# Patient Record
Sex: Female | Born: 1935 | Race: Black or African American | Hispanic: No | Smoking: Former smoker
Health system: Southern US, Community
[De-identification: ages and names within clinical notes are randomized; demographics above are authoritative.]

## PROBLEM LIST (undated history)

## (undated) DIAGNOSIS — D329 Benign neoplasm of meninges, unspecified: Secondary | ICD-10-CM

## (undated) DIAGNOSIS — M109 Gout, unspecified: Secondary | ICD-10-CM

## (undated) DIAGNOSIS — I10 Essential (primary) hypertension: Secondary | ICD-10-CM

## (undated) DIAGNOSIS — J45909 Unspecified asthma, uncomplicated: Secondary | ICD-10-CM

## (undated) DIAGNOSIS — F32A Depression, unspecified: Secondary | ICD-10-CM

## (undated) DIAGNOSIS — F329 Major depressive disorder, single episode, unspecified: Secondary | ICD-10-CM

## (undated) DIAGNOSIS — H409 Unspecified glaucoma: Secondary | ICD-10-CM

## (undated) DIAGNOSIS — E7800 Pure hypercholesterolemia, unspecified: Secondary | ICD-10-CM

## (undated) HISTORY — DX: Unspecified asthma, uncomplicated: J45.909

## (undated) HISTORY — DX: Major depressive disorder, single episode, unspecified: F32.9

## (undated) HISTORY — DX: Unspecified glaucoma: H40.9

## (undated) HISTORY — DX: Benign neoplasm of meninges, unspecified: D32.9

## (undated) HISTORY — DX: Gout, unspecified: M10.9

## (undated) HISTORY — DX: Depression, unspecified: F32.A

## (undated) HISTORY — DX: Essential (primary) hypertension: I10

## (undated) HISTORY — DX: Pure hypercholesterolemia, unspecified: E78.00

---

## 1999-11-02 ENCOUNTER — Encounter: Admission: RE | Admit: 1999-11-02 | Discharge: 1999-11-02 | Payer: Self-pay

## 1999-11-02 ENCOUNTER — Encounter: Payer: Self-pay | Admitting: Family Medicine

## 2000-11-03 ENCOUNTER — Encounter: Admission: RE | Admit: 2000-11-03 | Discharge: 2000-11-03 | Payer: Self-pay | Admitting: Family Medicine

## 2001-10-12 ENCOUNTER — Other Ambulatory Visit: Admission: RE | Admit: 2001-10-12 | Discharge: 2001-10-12 | Payer: Self-pay | Admitting: Family Medicine

## 2001-11-04 ENCOUNTER — Encounter: Payer: Self-pay | Admitting: Family Medicine

## 2001-11-04 ENCOUNTER — Encounter: Admission: RE | Admit: 2001-11-04 | Discharge: 2001-11-04 | Payer: Self-pay | Admitting: Family Medicine

## 2002-11-05 ENCOUNTER — Encounter: Payer: Self-pay | Admitting: Family Medicine

## 2002-11-05 ENCOUNTER — Encounter: Admission: RE | Admit: 2002-11-05 | Discharge: 2002-11-05 | Payer: Self-pay | Admitting: Family Medicine

## 2003-11-08 ENCOUNTER — Encounter: Admission: RE | Admit: 2003-11-08 | Discharge: 2003-11-08 | Payer: Self-pay | Admitting: Family Medicine

## 2003-12-19 ENCOUNTER — Encounter: Admission: RE | Admit: 2003-12-19 | Discharge: 2003-12-19 | Payer: Self-pay | Admitting: Family Medicine

## 2004-11-20 ENCOUNTER — Encounter: Admission: RE | Admit: 2004-11-20 | Discharge: 2004-11-20 | Payer: Self-pay | Admitting: Family Medicine

## 2005-11-28 ENCOUNTER — Encounter: Admission: RE | Admit: 2005-11-28 | Discharge: 2005-11-28 | Payer: Self-pay | Admitting: Family Medicine

## 2006-12-29 ENCOUNTER — Encounter: Admission: RE | Admit: 2006-12-29 | Discharge: 2006-12-29 | Payer: Self-pay | Admitting: Family Medicine

## 2007-12-31 ENCOUNTER — Encounter: Admission: RE | Admit: 2007-12-31 | Discharge: 2007-12-31 | Payer: Self-pay | Admitting: Family Medicine

## 2009-01-03 ENCOUNTER — Encounter: Admission: RE | Admit: 2009-01-03 | Discharge: 2009-01-03 | Payer: Self-pay | Admitting: Family Medicine

## 2010-03-30 ENCOUNTER — Encounter (INDEPENDENT_AMBULATORY_CARE_PROVIDER_SITE_OTHER): Payer: Self-pay | Admitting: *Deleted

## 2010-12-18 NOTE — Letter (Signed)
Summary: Colonoscopy Date Change Letter  [facility]  [address] [place], [place] [address]   Phone: [phone]  Fax: [phone]      Mar 30, 2010 MRN: [id number]   [facility]  [address] [place], [place]  [address]   Dear Ms. Bevens ,   Previously you were recommended to have a repeat colonoscopy around this time. Your chart was recently reviewed by Dr. Judie Petit T. Russella Dar of [facility]. Follow up colonoscopy is now recommended in June 2014. This revised recommendation is based on current, nationally recognized guidelines for colorectal cancer screening and polyp surveillance. These guidelines are endorsed by the American Cancer Society, [employer] on Colorectal Cancer as well as numerous other major medical organizations.  Please understand that our recommendation assumes that you do not have any new symptoms such as bleeding, a change in bowel habits, anemia, or significant abdominal discomfort. If you do have any concerning GI symptoms or want to discuss the guideline recommendations, please call to arrange an office visit at your earliest convenience. Otherwise we will keep you in our reminder system and contact you 1-2 months prior to the date listed above to schedule your next colonoscopy.  Thank you,  Judie Petit T. Russella Dar, M.D.  [facility] [phone]

## 2011-02-18 ENCOUNTER — Other Ambulatory Visit: Payer: Self-pay | Admitting: Family Medicine

## 2011-02-18 DIAGNOSIS — Z1231 Encounter for screening mammogram for malignant neoplasm of breast: Secondary | ICD-10-CM

## 2011-02-27 ENCOUNTER — Ambulatory Visit
Admission: RE | Admit: 2011-02-27 | Discharge: 2011-02-27 | Disposition: A | Discharge status: Home / Self Care | Payer: Medicare Other | Source: Ambulatory Visit | Attending: Family Medicine | Admitting: Family Medicine

## 2011-02-27 DIAGNOSIS — Z1231 Encounter for screening mammogram for malignant neoplasm of breast: Secondary | ICD-10-CM

## 2012-01-02 ENCOUNTER — Other Ambulatory Visit (HOSPITAL_COMMUNITY): Payer: Self-pay | Admitting: Family Medicine

## 2012-01-02 ENCOUNTER — Ambulatory Visit (HOSPITAL_COMMUNITY)
Admission: RE | Admit: 2012-01-02 | Discharge: 2012-01-02 | Disposition: A | Discharge status: Home / Self Care | Payer: Medicare Other | Source: Ambulatory Visit | Attending: Family Medicine | Admitting: Family Medicine

## 2012-01-02 DIAGNOSIS — R609 Edema, unspecified: Secondary | ICD-10-CM

## 2012-01-02 DIAGNOSIS — R0600 Dyspnea, unspecified: Secondary | ICD-10-CM

## 2012-01-02 DIAGNOSIS — R0989 Other specified symptoms and signs involving the circulatory and respiratory systems: Secondary | ICD-10-CM | POA: Insufficient documentation

## 2012-01-02 DIAGNOSIS — R0609 Other forms of dyspnea: Secondary | ICD-10-CM | POA: Insufficient documentation

## 2012-12-24 ENCOUNTER — Other Ambulatory Visit: Payer: Self-pay | Admitting: Family Medicine

## 2012-12-24 DIAGNOSIS — Z1231 Encounter for screening mammogram for malignant neoplasm of breast: Secondary | ICD-10-CM

## 2013-01-26 ENCOUNTER — Ambulatory Visit
Admission: RE | Admit: 2013-01-26 | Discharge: 2013-01-26 | Disposition: A | Discharge status: Home / Self Care | Payer: Medicare Other | Source: Ambulatory Visit | Attending: Family Medicine | Admitting: Family Medicine

## 2013-01-26 ENCOUNTER — Other Ambulatory Visit: Payer: Self-pay | Admitting: Family Medicine

## 2013-01-26 DIAGNOSIS — Z1231 Encounter for screening mammogram for malignant neoplasm of breast: Secondary | ICD-10-CM

## 2013-05-06 ENCOUNTER — Encounter: Payer: Self-pay | Admitting: Gastroenterology

## 2014-01-06 ENCOUNTER — Other Ambulatory Visit: Payer: Self-pay | Admitting: Family Medicine

## 2014-01-06 DIAGNOSIS — Z1231 Encounter for screening mammogram for malignant neoplasm of breast: Secondary | ICD-10-CM

## 2014-01-21 ENCOUNTER — Ambulatory Visit
Admission: RE | Admit: 2014-01-21 | Discharge: 2014-01-21 | Disposition: A | Discharge status: Home / Self Care | Payer: Self-pay | Source: Ambulatory Visit | Attending: Family Medicine | Admitting: Family Medicine

## 2014-01-21 DIAGNOSIS — Z1231 Encounter for screening mammogram for malignant neoplasm of breast: Secondary | ICD-10-CM

## 2015-01-10 ENCOUNTER — Encounter: Payer: Self-pay | Admitting: Gastroenterology

## 2015-03-15 ENCOUNTER — Other Ambulatory Visit: Payer: Self-pay

## 2015-03-15 DIAGNOSIS — Z1231 Encounter for screening mammogram for malignant neoplasm of breast: Secondary | ICD-10-CM

## 2015-03-23 ENCOUNTER — Encounter (INDEPENDENT_AMBULATORY_CARE_PROVIDER_SITE_OTHER): Payer: Self-pay

## 2015-03-23 ENCOUNTER — Ambulatory Visit
Admission: RE | Admit: 2015-03-23 | Discharge: 2015-03-23 | Disposition: A | Discharge status: Home / Self Care | Payer: Medicare Other | Source: Ambulatory Visit

## 2015-03-23 DIAGNOSIS — Z1231 Encounter for screening mammogram for malignant neoplasm of breast: Secondary | ICD-10-CM

## 2016-01-30 ENCOUNTER — Encounter: Payer: Self-pay | Admitting: Neurology

## 2016-01-30 ENCOUNTER — Ambulatory Visit (INDEPENDENT_AMBULATORY_CARE_PROVIDER_SITE_OTHER): Payer: Medicare Other | Admitting: Neurology

## 2016-01-30 VITALS — BP 128/71 | HR 104 | Ht 62.0 in | Wt 177.5 lb

## 2016-01-30 DIAGNOSIS — R269 Unspecified abnormalities of gait and mobility: Secondary | ICD-10-CM | POA: Diagnosis not present

## 2016-01-30 DIAGNOSIS — G3184 Mild cognitive impairment, so stated: Secondary | ICD-10-CM | POA: Insufficient documentation

## 2016-01-30 DIAGNOSIS — M545 Low back pain, unspecified: Secondary | ICD-10-CM | POA: Insufficient documentation

## 2016-01-30 DIAGNOSIS — M5442 Lumbago with sciatica, left side: Secondary | ICD-10-CM

## 2016-01-30 DIAGNOSIS — D329 Benign neoplasm of meninges, unspecified: Secondary | ICD-10-CM

## 2016-01-30 NOTE — Progress Notes (Addendum)
PATIENT: Leslie Warren DOB: Nov 06, 1931  Chief Complaint  Patient presents with  . Abnormal CT finding    MMSE 27/30 - 5 animals.  She is here with her daughter, Leslie Warren, to discuss a recent finding of a right-sided meningioma. Reports having difficulty with her memory.     HISTORICAL  Leslie Warren is 82 years old right-handed female, accompanied by her daughter Leslie Warren seen in refer by her primary care physician Dr.Sun Mikeal Hawthorne in January 30 2016 for evaluation of meningioma, memory loss  I reviewed and summarized referring note, she had a history of hypertension, hyperlipidemia, vitamin D deficiency known history of mild to moderate bilateral carotid artery stenosis, 40-50%.  She was noted to have memroy trouble since 2016, she tends to repeat herself, for this particular appointment, she has asked her daughter 3-4 time within a week, she knows her doctor's appointment is coming, but she could not remember the times, she has quit cooking since 2009, she forgets things on the stove, Her daughter is now cooking for her, she spent most of her day watching TV  She sleeps well, she does not have good appetite, she is not as active as she used to be, she complains of left hip pain, used a cane to walk since 2015,  She has no incontinence  Per patient and record, CAT scan of the brain without contrast in February 2017 showed meningioma, but I do not have detailed report  She also complains of chronic Leslie back pain, gradual onset progressive gait difficulty, she denies bowel and bladder incontinence.  REVIEW OF SYSTEMS: Full 14 system review of systems performed and notable only for Swelling in legs, glaucoma, shortness of breath, cough, increased thirst, achy muscles, memory loss, confusion, numbness, weakness, depression, too much sleep, decreased energy, change in appetite  ALLERGIES: No Known Allergies  HOME MEDICATIONS: Current Outpatient Prescriptions  Medication Sig Dispense Refill    . albuterol (PROVENTIL HFA;VENTOLIN HFA) 108 (90 Base) MCG/ACT inhaler Inhale into the lungs every 6 (six) hours as needed for wheezing or shortness of breath.    . allopurinol (ZYLOPRIM) 100 MG tablet Take 100 mg by mouth daily.  1  . dorzolamide (TRUSOPT) 2 % ophthalmic solution INSTILL 1 DROP INTO BOTH EYES 3 TIMES DAILY  11  . ibuprofen (ADVIL,MOTRIN) 800 MG tablet Take 800 mg by mouth every 8 (eight) hours as needed.    Marland Kitchen LUMIGAN 0.01 % SOLN INSTILL 1 DROP BY OPHTHALMIC ROUTE EVERY BEDTIME  5  . meloxicam (MOBIC) 15 MG tablet Take 15 mg by mouth daily.  5  . quinapril (ACCUPRIL) 20 MG tablet Take 20 mg by mouth daily.  1  . simvastatin (ZOCOR) 40 MG tablet Take 40 mg by mouth daily.  3  . thioridazine (MELLARIL) 25 MG tablet Take 25 mg by mouth at bedtime.  2  . triamterene-hydrochlorothiazide (MAXZIDE-25) 37.5-25 MG tablet Take 1 tablet by mouth daily.  3   No current facility-administered medications for this visit.    PAST MEDICAL HISTORY: Past Medical History  Diagnosis Date  . Hypercholesterolemia   . Hypertension   . Gout   . Depression   . Glaucoma   . Asthma   . Meningioma ([place])     PAST SURGICAL HISTORY: Past Surgical History  Procedure Laterality Date  . Cesarean section      x 1    FAMILY HISTORY: Family History  Problem Relation Age of Onset  . Diabetes Father   .  Hypertension Father   . Heart attack Mother     SOCIAL HISTORY:  Social History   Social History  . Marital Status: Widowed    Spouse Name: N/A  . Number of Children: 5  . Years of Education: 10   Occupational History  . Retired    Social History Main Topics  . Smoking status: Former Research scientist (life sciences)  . Smokeless tobacco: Not on file     Comment: Quit 10+ years ago.  . Alcohol Use: No  . Drug Use: No  . Sexual Activity: Not on file   Other Topics Concern  . Not on file   Social History Narrative   Lives with her daughter, Leslie Warren.   Right-handed.   1-2 cups caffeine per  day.     PHYSICAL EXAM   Filed Vitals:   01/30/16 1334  BP: 128/71  Pulse: 104  Height: 5\' 2"  (1.575 m)  Weight: 177 lb 8 oz (80.513 kg)    Not recorded      Body mass index is 32.46 kg/(m^2).  PHYSICAL EXAMNIATION:  Gen: NAD, conversant, well nourised, obese, well groomed                     Cardiovascular: Regular rate rhythm, no peripheral edema, warm, nontender. Eyes: Conjunctivae clear without exudates or hemorrhage Neck: Supple, no carotid bruise. Pulmonary: Clear to auscultation bilaterally  Abdomen: Football size abdomen herniation  NEUROLOGICAL EXAM:  MENTAL STATUS: Speech:    Speech is normal; fluent and spontaneous with normal comprehension.  Cognition: Mini-Mental Status Examination 27/30, animal naming 5      Orientation to time, place and person: She is not oriented to date     Recent and remote memory: She missed 2/3 recall     Normal Attention span and concentration     Normal Language, naming, repeating,spontaneous speech     Fund of knowledge   CRANIAL NERVES: CN II: Visual fields are full to confrontation. Fundoscopic exam is normal with sharp discs and no vascular changes. Pupils are round equal and briskly reactive to light. CN III, IV, VI: extraocular movement are normal. No ptosis. CN V: Facial sensation is intact to pinprick in all 3 divisions bilaterally. Corneal responses are intact.  CN VII: Face is symmetric with normal eye closure and smile. CN VIII: Hearing is normal to rubbing fingers CN IX, X: Palate elevates symmetrically. Phonation is normal. CN XI: Head turning and shoulder shrug are intact CN XII: Tongue is midline with normal movements and no atrophy.  MOTOR: There is no pronator drift of out-stretched arms. Muscle bulk and tone are normal. Muscle strength is normal, with exception of mild bilateral ankle dorsiflexion weakness  REFLEXES: Reflexes are 2+ and symmetric at the biceps, triceps, knees, and absent at ankles. Plantar  responses are flexor.  SENSORY: Length dependent decreased to light touch, pinprick, and vibratory sensation to knee level.  COORDINATION: Rapid alternating movements and fine finger movements are intact. There is no dysmetria on finger-to-nose and heel-knee-shin.    GAIT/STANCE: She needs to push up to get up from seated position, cautious, unsteady gait, could not walk on tiptoe heels and tandem walking   DIAGNOSTIC DATA (LABS, IMAGING, TESTING) - I reviewed patient records, labs, notes, testing and imaging myself where available.   ASSESSMENT AND PLAN  CHARNELLE ALBERTI is a 82 y.o. female   Mild cognitive impairment  Mini-Mental Status Examination is 86/30  Laboratory result from primary care physician Meningioma  MRI  of the brain with and without contrast Gait abnormality  She has distal weakness, sensory loss, chronic Leslie back pain, radiating pain to left leg  Most consistent with lumbar stenosis  Proceed with EMG nerve conduction study  MRI of lumbar  Marcial Pacas, M.D. Ph.D.  [facility] [address], [place], [place] [address] Ph: [phone] Fax: [phone]  CC:Sandi Mariscal, MD

## 2016-02-05 ENCOUNTER — Telehealth: Payer: Self-pay | Admitting: *Deleted

## 2016-02-05 NOTE — Telephone Encounter (Signed)
Labs collected 01/08/16:  LIPID PANEL: LDL - 88  VITAMIN D: <4.20  TSH - 0.984  CMP: CREATININE - 1.4

## 2016-02-08 ENCOUNTER — Ambulatory Visit
Admission: RE | Admit: 2016-02-08 | Discharge: 2016-02-08 | Disposition: A | Discharge status: Home / Self Care | Payer: Medicare Other | Source: Ambulatory Visit | Attending: Neurology | Admitting: Neurology

## 2016-02-08 DIAGNOSIS — M5442 Lumbago with sciatica, left side: Secondary | ICD-10-CM

## 2016-02-08 DIAGNOSIS — D329 Benign neoplasm of meninges, unspecified: Secondary | ICD-10-CM

## 2016-02-08 DIAGNOSIS — G3184 Mild cognitive impairment, so stated: Secondary | ICD-10-CM

## 2016-02-08 DIAGNOSIS — R269 Unspecified abnormalities of gait and mobility: Secondary | ICD-10-CM | POA: Diagnosis not present

## 2016-02-08 MED ORDER — GADOBENATE DIMEGLUMINE 529 MG/ML IV SOLN
15.0000 mL | Freq: Once | INTRAVENOUS | Status: AC | PRN
  Administered 2016-02-08: 15 mL via INTRAVENOUS

## 2016-02-12 ENCOUNTER — Telehealth: Payer: Self-pay | Admitting: Neurology

## 2016-02-12 NOTE — Telephone Encounter (Signed)
Spoke to [employer] - she is aware of her mother's MRI results and will have her keep her pending appts.

## 2016-02-12 NOTE — Telephone Encounter (Addendum)
error 

## 2016-02-12 NOTE — Telephone Encounter (Addendum)
Please call patient, MRI of the brain showed evidence of small vessel disease, mild atrophy, evidence of meningioma, MRI of the lumbar showed multilevel degenerative disc disease, most severe at L2-3, with evidence of nerve root compression,   I will review MRI of the brain in detail at her next follow-up visit, if she wants, may move up her follow-up appointment   IMPRESSION: This MRI of the brain with and without contrast shows the following: 1. There are some scattered T2/FLAIR hyperintense foci in the pons and cerebral hemispheres consistent with mild chronic microvascular ischemic change. The extent is apical for age. 2. There is mild cortical atrophy, slightly more pronounced in the mesial temporal lobes.  3. Attached to the falx cerebri on the right is a small 712 mm enhancing mass consistent with a benign meningioma The adjacent brain appears normal. 4. Mild chronic left maxillary sinusitis. 5. Pituitary tissue is thinned within a slightly enlarged sella turcica. This could be an incidental finding but could also be noted with increased intracranial pressure  IMPRESSION: This is an abnormal MRI of the lumbar spine with moderate to severe multilevel degenerative changes throughout the lumbar spine. The most significant findings are: 1. At L2-L3 there is severe spinal stenosis due to a large synovial cyst arising from the left facet joint that causes severe lateral recess stenosis with probable left L3 nerve root compression. Other left-sided nerve roots are also displaced at this level. 2. At L3-L4 there is moderate spinal stenosis and moderate bilateral lateral recess stenosis. There is no definite nerve root compression but there is some encroachment upon the exiting L4 nerve roots. 3. At L4-L5 there is severe spinal stenosis due to a right paramedian disc protrusion and left greater than right facet hypertrophy. There is severe bilateral lateral recess stenosis with  potential for compression of either of the L5 nerve roots. 4. Degenerative changes at T12-L1, L1-L2 and L5-S1 are less likely to lead to nerve root impingement.

## 2016-03-12 ENCOUNTER — Ambulatory Visit (INDEPENDENT_AMBULATORY_CARE_PROVIDER_SITE_OTHER): Payer: Medicare Other | Admitting: Neurology

## 2016-03-12 DIAGNOSIS — R269 Unspecified abnormalities of gait and mobility: Secondary | ICD-10-CM

## 2016-03-12 DIAGNOSIS — D329 Benign neoplasm of meninges, unspecified: Secondary | ICD-10-CM

## 2016-03-12 DIAGNOSIS — M5442 Lumbago with sciatica, left side: Secondary | ICD-10-CM

## 2016-03-12 DIAGNOSIS — G3184 Mild cognitive impairment, so stated: Secondary | ICD-10-CM

## 2016-03-12 NOTE — Procedures (Signed)
   NCS (NERVE CONDUCTION STUDY) WITH EMG (ELECTROMYOGRAPHY) REPORT   STUDY DATE: March 12 2016 PATIENT NAME: Leslie Warren DOB: 1931-01-27 MRN: [id number]    TECHNOLOGIST: Laretta Alstrom ELECTROMYOGRAPHER: Marcial Pacas M.D.  CLINICAL INFORMATION:  82 years old female with history of chronic low back pain, gradual onset gait difficulty, distal weakness, sensory loss.  FINDINGS: NERVE CONDUCTION STUDY: Bilateral peroneal sensory responses were present and within normal limits. Bilateral tibial motor responses were normal. Bilateral peroneal to EDB motor responses showed mildly decreased to C map amplitude, with normal distal latency, conduction velocity. Bilateral tibial H reflexes were normal and symmetric.  NEEDLE ELECTROMYOGRAPHY: Selective needle examinations were performed at bilateral lower extremity muscles and bilateral lumbar sacral paraspinal muscles.  Left tibialis anterior, peroneal longus, medial gastrocnemius, tibialis posterior: Increased insertional activity, 2-3 plus spontaneous activity, enlarge complex motor unit potential with decreased recruitment patterns.  Right tibialis anterior, peroneal longus, tibialis posterior, medial gastrocnemius, increased insertional activity, 1 plus positive waves, enlarge complex motor unit potential with decreased recruitment patterns.  Bilateral vastus lateralis, biceps femoris long head: Increased insertional activity, enlarged complex motor unit potential with decreased recruitment patterns.  Needle examination of bilateral lumbar sacral paraspinal muscles showed poor relaxation, there was no significant denervation potential noticed at bilateral L4-5 S1.    IMPRESSION:   This is an abnormal study. There is evidence of bilateral lumbar sacral radiculopathy mainly involving bilateral L4, 5, S1 myotomes, left worse than right.   INTERPRETING PHYSICIAN:   Marcial Pacas M.D. Ph.D. [facility] [address] [place], [place] [address] [phone]

## 2016-03-12 NOTE — Progress Notes (Signed)
PATIENT: Leslie Warren DOB: 11/22/1930  No chief complaint on file.    HISTORICAL  Leslie Warren is 82 years old right-handed female, accompanied by her daughter low Ladoris seen in refer by her primary care physician Dr.Sun Mikeal Hawthorne in January 30 2016 for evaluation of meningioma, memory loss  I reviewed and summarized referring note, she had a history of hypertension, hyperlipidemia, vitamin D deficiency known history of mild to moderate bilateral carotid artery stenosis, 40-50%.  She was noted to have memroy trouble since 2016, she tends to repeat herself, for this particular appointment, she has asked her daughter 3-4 time within a week, she knows her doctor's appointment is coming, but she could not remember the times, she has quit cooking since 2009, she forgets things on the stove, Her daughter is now cooking for her, she spent most of her day watching TV  She sleeps well, she does not have good appetite, she is not as active as she used to be, she complains of left hip pain, used a cane to walk since 2015,  She has no incontinence  Per patient and record, CAT scan of the brain without contrast in February 2017 showed meningioma, but I do not have detailed report  Update March 12 2016: She returned for electrodiagnostic study today, which showed evidence of active bilateral lumbosacral radiculopathy, mainly involving bilateral L4-5 S1 myotomes, left worse than right.  We also personally reviewed MRI of the brain April 2017: Mild Generalized atrophy, mild supratentorium small vessel disease, right falx cerebri 7 x12 mm meningioma, MRI of lumbar in April 2017, multilevel degenerative disc disease, most severe at L2-3, severe spinal stenosis, large synovia cyst from the left facet joints, causing left L3 nerve root compression, L3-4, moderate canal stenosis, severe bilateral recess stenosis, L4-5, severe spinal canal stenosis, due to right paramedian disc protrusion, severe bilateral recess  stenosis,  Patient complains of persistent low back pain, gradual worsening gait difficulty since 2015, bending forward, bilateral lower extremity paresthesia, there is no  bowel and bladder incontinence  Reviewed laboratory evaluation, LDL 88, vitamin D less than 4.2, normal TSH 0.98, CMP was normal other than elevated creatinine 1.4.  REVIEW OF SYSTEMS: Full 14 system review of systems performed and notable only for memory loss ALLERGIES: No Known Allergies  HOME MEDICATIONS: Current Outpatient Prescriptions  Medication Sig Dispense Refill  . albuterol (PROVENTIL HFA;VENTOLIN HFA) 108 (90 Base) MCG/ACT inhaler Inhale into the lungs every 6 (six) hours as needed for wheezing or shortness of breath.    . allopurinol (ZYLOPRIM) 100 MG tablet Take 100 mg by mouth daily.  1  . dorzolamide (TRUSOPT) 2 % ophthalmic solution INSTILL 1 DROP INTO BOTH EYES 3 TIMES DAILY  11  . ibuprofen (ADVIL,MOTRIN) 800 MG tablet Take 800 mg by mouth every 8 (eight) hours as needed.    Marland Kitchen LUMIGAN 0.01 % SOLN INSTILL 1 DROP BY OPHTHALMIC ROUTE EVERY BEDTIME  5  . meloxicam (MOBIC) 15 MG tablet Take 15 mg by mouth daily.  5  . quinapril (ACCUPRIL) 20 MG tablet Take 20 mg by mouth daily.  1  . simvastatin (ZOCOR) 40 MG tablet Take 40 mg by mouth daily.  3  . thioridazine (MELLARIL) 25 MG tablet Take 25 mg by mouth at bedtime.  2  . triamterene-hydrochlorothiazide (MAXZIDE-25) 37.5-25 MG tablet Take 1 tablet by mouth daily.  3   No current facility-administered medications for this visit.    PAST MEDICAL HISTORY: Past Medical History  Diagnosis Date  . Hypercholesterolemia   . Hypertension   . Gout   . Depression   . Glaucoma   . Asthma   . Meningioma ([place])     PAST SURGICAL HISTORY: Past Surgical History  Procedure Laterality Date  . Cesarean section      x 1    FAMILY HISTORY: Family History  Problem Relation Age of Onset  . Diabetes Father   . Hypertension Father   . Heart attack Mother      SOCIAL HISTORY:  Social History   Social History  . Marital Status: Widowed    Spouse Name: N/A  . Number of Children: 5  . Years of Education: 10   Occupational History  . Retired    Social History Main Topics  . Smoking status: Former Research scientist (life sciences)  . Smokeless tobacco: Not on file     Comment: Quit 10+ years ago.  . Alcohol Use: No  . Drug Use: No  . Sexual Activity: Not on file   Other Topics Concern  . Not on file   Social History Narrative   Lives with her daughter, Alysan Herrell.   Right-handed.   1-2 cups caffeine per day.     PHYSICAL EXAM   There were no vitals filed for this visit.  Not recorded      There is no weight on file to calculate BMI.  PHYSICAL EXAMNIATION:  Gen: NAD, conversant, well nourised, obese, well groomed                     Cardiovascular: Regular rate rhythm, no peripheral edema, warm, nontender. Eyes: Conjunctivae clear without exudates or hemorrhage Neck: Supple, no carotid bruise. Pulmonary: Clear to auscultation bilaterally  Abdomen: Football size abdomen herniation  NEUROLOGICAL EXAM:  MENTAL STATUS: Speech:    Speech is normal; fluent and spontaneous with normal comprehension.  Cognition: Mini-Mental Status Examination 27/30, animal naming 5      Orientation to time, place and person: She is not oriented to date     Recent and remote memory: She missed 2/3 recall     Normal Attention span and concentration     Normal Language, naming, repeating,spontaneous speech     Fund of knowledge   CRANIAL NERVES: CN II: Visual fields are full to confrontation. Pupils are round equal and briskly reactive to light. CN III, IV, VI: extraocular movement are normal. No ptosis. CN V: Facial sensation is intact to pinprick in all 3 divisions bilaterally. Corneal responses are intact.  CN VII: Face is symmetric with normal eye closure and smile. CN VIII: Hearing is normal to rubbing fingers CN IX, X: Palate elevates  symmetrically. Phonation is normal. CN XI: Head turning and shoulder shrug are intact CN XII: Tongue is midline with normal movements and no atrophy.  MOTOR: She has mild bilateral hip flexion, knee flexion weakness, left worse than right, moderate bilateral ankle dorsiflexion weakness, left worse than right.  REFLEXES: Reflexes are 2+ and symmetric at the biceps, triceps, absent at knees, and absent at ankles. Plantar responses are flexor.  SENSORY: Length dependent decreased to light touch, pinprick, and vibratory sensation to knee level.  COORDINATION: Rapid alternating movements and fine finger movements are intact. There is no dysmetria on finger-to-nose and heel-knee-shin.    GAIT/STANCE: She needs push up to get up from seated position, bending forward, unsteady gait, dragging her left leg  DIAGNOSTIC DATA (LABS, IMAGING, TESTING) - I reviewed patient records, labs, notes, testing and  imaging myself where available.   ASSESSMENT AND PLAN  LUCCIANA LEUSCHEN is a 82 y.o. female   Mild cognitive impairment  Mini-Mental Status Examination is 27/30  MRI of the brain showed mild atrophy, small vessel disease,  Need B12 level at her next yearly follow-up   Lumbar stenosis, gait abnormality, evidence of bilateral lumbar sacral radiculopathy  Patient denies surgery consideration, patient is in moderate pain, add on gabapentin 300 mg 3 times a day,     Marcial Pacas, M.D. Ph.D.  [facility] [address] [place], [place] [address] Ph: [phone] Fax: [phone]  CC:Sandi Mariscal, MD

## 2016-03-15 ENCOUNTER — Telehealth: Payer: Self-pay | Admitting: Neurology

## 2016-03-15 NOTE — Telephone Encounter (Signed)
Daughter Leslie Warren called, states when patient was here for visit 4/25, Dr. Krista Blue was going to send a Rx for pain medication to [facility]. Pharmacy has advised Rx hasn't come through yet.

## 2016-03-18 MED ORDER — GABAPENTIN 300 MG PO CAPS: 300.0000 mg | ORAL_CAPSULE | Freq: Three times a day (TID) | ORAL | Status: AC

## 2016-03-18 NOTE — Telephone Encounter (Signed)
Returned call to daugher on [employer]IP[employer] - she is aware that the prescription has been sent to the pharmacy.

## 2016-03-18 NOTE — Telephone Encounter (Signed)
Please let patient know, I have called in gabapentin 300 mg 3 times a day

## 2016-08-08 ENCOUNTER — Encounter: Payer: Self-pay | Admitting: Neurology

## 2016-08-08 ENCOUNTER — Ambulatory Visit
Admission: RE | Admit: 2016-08-08 | Discharge: 2016-08-08 | Disposition: A | Discharge status: Home / Self Care | Payer: Medicare Other | Source: Ambulatory Visit | Attending: Neurology | Admitting: Neurology

## 2016-08-08 ENCOUNTER — Ambulatory Visit (INDEPENDENT_AMBULATORY_CARE_PROVIDER_SITE_OTHER): Payer: Medicare Other | Admitting: Neurology

## 2016-08-08 VITALS — Ht 62.0 in | Wt 172.0 lb

## 2016-08-08 DIAGNOSIS — G3184 Mild cognitive impairment, so stated: Secondary | ICD-10-CM

## 2016-08-08 DIAGNOSIS — D329 Benign neoplasm of meninges, unspecified: Secondary | ICD-10-CM

## 2016-08-08 DIAGNOSIS — M5416 Radiculopathy, lumbar region: Secondary | ICD-10-CM

## 2016-08-08 NOTE — Progress Notes (Signed)
PATIENT: Leslie Warren DOB: 05/03/1931  Chief Complaint  Patient presents with  . Memory Loss    MMSE 13/30 - 3 animals.  She is here with her daughter, Leslie Warren, who says she has noticed a sharp decline in her mother's memory.  . Back Pain    Reports improvement in back pain and gait.  She has not taken gabapentin recently due to this improvement.  She uses a cane to assist with ambulation.     HISTORICAL  Leslie Warren is 82 years old right-handed female, accompanied by her daughter Leslie Warren seen in refer by her primary care physician Dr.Sun Mikeal Hawthorne on January 30 2016 for evaluation of meningioma, memory loss  I reviewed and summarized referring note, she had a history of hypertension, hyperlipidemia, vitamin D deficiency known history of mild to moderate bilateral carotid artery stenosis, 40-50%.  She was noted to have memory trouble since 2016, she tends to repeat herself, for this particular appointment, she has asked her daughter 3-4 time within a week, she knows her doctor's appointment is coming, but she could not remember the times, she has quit cooking since 2009, she forgets things on the stove, Her daughter is now cooking for her, she spent most of her day watching TV.  She graduate from high school, worked at [employer], retired at age 29, there was no family history of memory loss, she lives with her daughter Leslie Warren   She sleeps well, she does not have good appetite, she is not as active as she used to be, she complains of left hip pain, used a cane to walk since 2015,  She has no incontinence  Per patient and record, CAT scan of the brain without contrast in February 2017 showed meningioma, but I do not have detailed report  She also complains of chronic Leslie back pain, gradual onset progressive gait difficulty, she denies bowel and bladder incontinence.  UPDATE Aug 08 2016: Patient is accompanied by her daughter at today's clinical visit, she was noted to have increased  confusion, fatigue, gait abnormality since Monday Sept 18th, 2017, she does not talk much, no fever, sleeps a lot, she was taken to her primary care physician, reported UA looks normal, l We have personally reviewed MRI of the brain with without contrast in March 2017: Mild generalized atrophy, supratentorium small vessel disease, right falx cerebral small 7 times 12 mm meningioma  MRI of lumbar spine , severe spinal stenosis at L2-3 due to synovial cyst arising from the left facet joint, cause severe left recess stenosis, moderate stenosis at L 3-4, moderate bilateral lateral recess stenosis, severe stenosis at L4-5, right paramedian disc protrusion left greater than right facet hypertrophy with severe bilateral lateral recess stenosis  EMG nerve conduction study in April 2017 showed evidence of chronic bilateral lumbosacral radiculopathy  REVIEW OF SYSTEMS: Full 14 system review of systems performed and notable only for back pain, walking difficulty, memory loss   ALLERGIES: No Known Allergies  HOME MEDICATIONS: Current Outpatient Prescriptions  Medication Sig Dispense Refill  . albuterol (PROVENTIL HFA;VENTOLIN HFA) 108 (90 Base) MCG/ACT inhaler Inhale into the lungs every 6 (six) hours as needed for wheezing or shortness of breath.    . allopurinol (ZYLOPRIM) 100 MG tablet Take 100 mg by mouth daily.  1  . dorzolamide (TRUSOPT) 2 % ophthalmic solution INSTILL 1 DROP INTO BOTH EYES 3 TIMES DAILY  11  . gabapentin (NEURONTIN) 300 MG capsule Take 1 capsule (300 mg total)  by mouth 3 (three) times daily. 90 capsule 11  . ibuprofen (ADVIL,MOTRIN) 800 MG tablet Take 800 mg by mouth every 8 (eight) hours as needed.    Marland Kitchen LUMIGAN 0.01 % SOLN INSTILL 1 DROP BY OPHTHALMIC ROUTE EVERY BEDTIME  5  . meloxicam (MOBIC) 15 MG tablet Take 15 mg by mouth daily.  5  . quinapril (ACCUPRIL) 20 MG tablet Take 20 mg by mouth daily.  1  . simvastatin (ZOCOR) 40 MG tablet Take 40 mg by mouth daily.  3  .  thioridazine (MELLARIL) 25 MG tablet Take 25 mg by mouth at bedtime.  2  . triamterene-hydrochlorothiazide (MAXZIDE-25) 37.5-25 MG tablet Take 1 tablet by mouth daily.  3   No current facility-administered medications for this visit.     PAST MEDICAL HISTORY: Past Medical History:  Diagnosis Date  . Asthma   . Depression   . Glaucoma   . Gout   . Hypercholesterolemia   . Hypertension   . Meningioma ([place])     PAST SURGICAL HISTORY: Past Surgical History:  Procedure Laterality Date  . CESAREAN SECTION     x 1    FAMILY HISTORY: Family History  Problem Relation Age of Onset  . Diabetes Father   . Hypertension Father   . Heart attack Mother     SOCIAL HISTORY:  Social History   Social History  . Marital status: Widowed    Spouse name: N/A  . Number of children: 5  . Years of education: 10   Occupational History  . Retired    Social History Main Topics  . Smoking status: Former Research scientist (life sciences)  . Smokeless tobacco: Not on file     Comment: Quit 10+ years ago.  . Alcohol use No  . Drug use: No  . Sexual activity: Not on file   Other Topics Concern  . Not on file   Social History Narrative   Lives with her daughter, Leslie Warren.   Right-handed.   1-2 cups caffeine per day.     PHYSICAL EXAM   Vitals:   08/08/16 1155  Weight: 172 lb (78 kg)  Height: 5\' 2"  (1.575 m)    Not recorded      Body mass index is 31.46 kg/m.  PHYSICAL EXAMNIATION:  Gen: NAD, conversant, well nourised, obese, well groomed                     Cardiovascular: Regular rate rhythm, no peripheral edema, warm, nontender. Eyes: Conjunctivae clear without exudates or hemorrhage Neck: Supple, no carotid bruise. Pulmonary: Clear to auscultation bilaterally  Abdomen: Football size abdomen herniation  NEUROLOGICAL EXAM:  MENTAL STATUS: Speech: She follows simple commands, depend on her daughter to provide history,  Cognition: Mini-Mental Status Examination13/30, animal  naming  3     Orientation: She is not oriented to datePlace,     Recent and remote memory: She missed 2/3 recall     Attention span and concentration: She could not spell world backwards     Normal Language, naming, repeating,spontaneous speech, She has difficulty copy design, could not write a sentence,     Fund of knowledge   CRANIAL NERVES: CN II: Visual fields are full to confrontation. Fundoscopic exam is normal with sharp discs and no vascular changes. Pupils are round equal and briskly reactive to light. CN III, IV, VI: extraocular movement are normal. No ptosis. CN V: Facial sensation is intact to pinprick in all 3 divisions bilaterally. Corneal responses are  intact.  CN VII: Face is symmetric with normal eye closure and smile. CN VIII: Hearing is normal to rubbing fingers CN IX, X: Palate elevates symmetrically. Phonation is normal. CN XI: Head turning and shoulder shrug are intact CN XII: Tongue is midline with normal movements and no atrophy.  MOTOR: There is no pronator drift of out-stretched arms. Muscle bulk and tone are normal. Muscle strength is normal, with exception of mild bilateral ankle dorsiflexion weakness  REFLEXES: Reflexes are 2+ and symmetric at the biceps, triceps, knees, and absent at ankles. Plantar responses are flexor.  SENSORY: Length dependent decreased to light touch, pinprick, and vibratory sensation to knee level.  COORDINATION: Rapid alternating movements and fine finger movements are intact. There is no dysmetria on finger-to-nose and heel-knee-shin.    GAIT/STANCE: She needs to push up to get up from seated position, leaning forward depend on her walker, unsteady gait   DIAGNOSTIC DATA (LABS, IMAGING, TESTING) - I reviewed patient records, labs, notes, testing and imaging myself where available.   ASSESSMENT AND PLAN  MARYMARGARET ECKEL is a 82 y.o. female   Mild cognitive impairment  Acute worsening since August 05 2016  laboratory  evaluation to rule out dehydration, thyroid malfunction,  EEG  I have encouraged her daughter close supervised patient, keep well hydration,   Gait abnormality  Evidence of multilevel severe lumbar stenosis,  discussed with patient and her daughter, she is not in severe pain, does not want to consider surgical option  Marcial Pacas, M.D. Ph.D.  [facility] [address] [place], [place] [address] Ph: [phone] Fax: [phone]  CC:Leslie Mariscal, MD

## 2016-08-09 ENCOUNTER — Telehealth: Payer: Self-pay | Admitting: Neurology

## 2016-08-09 LAB — CBC
Hematocrit: 38.6 % (ref 34.0–46.6)
Hemoglobin: 12.8 g/dL (ref 11.1–15.9)
MCH: 27.3 pg (ref 26.6–33.0)
MCHC: 33.2 g/dL (ref 31.5–35.7)
MCV: 82 fL (ref 79–97)
PLATELETS: 261 10*3/uL (ref 150–379)
RBC: 4.69 x10E6/uL (ref 3.77–5.28)
RDW: 13.1 % (ref 12.3–15.4)
WBC: 8.8 10*3/uL (ref 3.4–10.8)

## 2016-08-09 LAB — COMPREHENSIVE METABOLIC PANEL
ALT: 22 IU/L (ref 0–32)
AST: 20 IU/L (ref 0–40)
Albumin/Globulin Ratio: 1.5 (ref 1.2–2.2)
Albumin: 4 g/dL (ref 3.5–4.7)
Alkaline Phosphatase: 86 IU/L (ref 39–117)
BUN/Creatinine Ratio: 11 — ABNORMAL LOW (ref 12–28)
BUN: 13 mg/dL (ref 8–27)
Bilirubin Total: 0.4 mg/dL (ref 0.0–1.2)
CALCIUM: 11.7 mg/dL — AB (ref 8.7–10.3)
CO2: 27 mmol/L (ref 18–29)
CREATININE: 1.14 mg/dL — AB (ref 0.57–1.00)
Chloride: 91 mmol/L — ABNORMAL LOW (ref 96–106)
GFR, EST AFRICAN AMERICAN: 52 mL/min/{1.73_m2} — AB (ref >59)
GFR, EST NON AFRICAN AMERICAN: 45 mL/min/{1.73_m2} — AB (ref >59)
GLUCOSE: 100 mg/dL — AB (ref 65–99)
Globulin, Total: 2.7 g/dL (ref 1.5–4.5)
Potassium: 3.6 mmol/L (ref 3.5–5.2)
Sodium: 131 mmol/L — ABNORMAL LOW (ref 134–144)
TOTAL PROTEIN: 6.7 g/dL (ref 6.0–8.5)

## 2016-08-09 LAB — TSH: TSH: 1.26 u[IU]/mL (ref 0.450–4.500)

## 2016-08-09 NOTE — Telephone Encounter (Addendum)
Please call patient x-ray showed bronchitis changes,  Laboratory evaluation showed mildly decreased sodium, chloride, increased calcium,   if she continue to cough, continue to be off her baseline, with increased confusion, fatigue, may consider follow-up with her primary care physician again, also repeat lab again, I have faxed the lab result to her PCP Dr. Nancy Fetter  FINDINGS: Heart size is mildly enlarged. Aorta is tortuous. There are no focal consolidations or pleural effusions. No pulmonary edema. Mild perihilar bronchitic changes are present. Mid thoracic spondylosis.  IMPRESSION: 1. Stable cardiomegaly. 2. Bronchitic changes. No focal acute pulmonary abnormality.

## 2016-08-09 NOTE — Telephone Encounter (Signed)
Left message for Leslie Warren [phone]) to return my call.

## 2016-08-12 NOTE — Telephone Encounter (Signed)
Spoke to Leslie Warren - she is aware of her mother's test results.  She verbalized understanding and will keep a close eyes on her symptoms.

## 2016-08-14 ENCOUNTER — Ambulatory Visit (INDEPENDENT_AMBULATORY_CARE_PROVIDER_SITE_OTHER): Payer: Medicare Other | Admitting: Neurology

## 2016-08-14 DIAGNOSIS — M5416 Radiculopathy, lumbar region: Secondary | ICD-10-CM

## 2016-08-14 DIAGNOSIS — D329 Benign neoplasm of meninges, unspecified: Secondary | ICD-10-CM

## 2016-08-14 DIAGNOSIS — G3184 Mild cognitive impairment, so stated: Secondary | ICD-10-CM

## 2016-08-14 DIAGNOSIS — R410 Disorientation, unspecified: Secondary | ICD-10-CM | POA: Diagnosis not present

## 2016-08-14 NOTE — Procedures (Signed)
   HISTORY: History of meningioma, memory loss, presented with increased confusion,  TECHNIQUE:  16 channel EEG was performed based on standard 10-16 international system. One channel was dedicated to EKG, which has demonstrates normal sinus rhythm of 84 beats per minutes.  Upon awakening, the posterior background activity was dysarrhythmic, in theta range, reactive to eye opening and closure. There was frequent higher amplitude slower delta range activity is bilateral frontal area.  There was no evidence of epileptiform discharge.  Photic stimulation was performed, which induced a symmetric photic driving, continued evidence of generalized dysrhythmic slowing.  Hyperventilation was not performed.  No sleep was achieved.  CONCLUSION: This is a  an abnormal EEG.  There is electrodiagnostic evidence of generalized slowing, consistent with bihemisphere malfunction, common etiology is metabolic toxic.   Marcial Pacas, M.D. Ph.D.  [facility] [place], [place] [address] Phone: [phone] Fax:      [phone]

## 2016-08-21 ENCOUNTER — Telehealth: Payer: Self-pay | Admitting: Neurology

## 2016-08-21 NOTE — Telephone Encounter (Signed)
Attempted to reach again before leaving the office. Left another message.

## 2016-08-21 NOTE — Telephone Encounter (Signed)
Left message for a return call

## 2016-08-21 NOTE — Telephone Encounter (Signed)
Please call patient there is EEG evidence of slowing, consistent with bihemisphere malfunction, there was no evidence of epileptiform discharge   Please also check with patient to see if her symptoms has improved  This is a  an abnormal EEG.  There is electrodiagnostic evidence of generalized slowing, consistent with bihemisphere malfunction, common etiology is metabolic toxic

## 2016-08-21 NOTE — Telephone Encounter (Signed)
Pt's daughter called request EEG results. She can be reached on work or cell [phone]

## 2016-08-22 NOTE — Telephone Encounter (Signed)
Spoke to [employer] (dgt on HIPAA) - she is aware of her mother's EEG results.

## 2016-08-22 NOTE — Telephone Encounter (Signed)
Pt's daughter returned Michelle's call. She can be reached at [phone]

## 2016-09-11 ENCOUNTER — Ambulatory Visit: Payer: Medicare Other | Admitting: Neurology

## 2016-11-13 ENCOUNTER — Encounter: Payer: Self-pay | Admitting: Nurse Practitioner

## 2016-11-13 ENCOUNTER — Ambulatory Visit (INDEPENDENT_AMBULATORY_CARE_PROVIDER_SITE_OTHER): Payer: Medicare Other | Admitting: Nurse Practitioner

## 2016-11-13 VITALS — BP 128/84 | HR 96 | Ht 62.0 in | Wt 172.0 lb

## 2016-11-13 DIAGNOSIS — R269 Unspecified abnormalities of gait and mobility: Secondary | ICD-10-CM | POA: Diagnosis not present

## 2016-11-13 DIAGNOSIS — G3184 Mild cognitive impairment, so stated: Secondary | ICD-10-CM | POA: Diagnosis not present

## 2016-11-13 DIAGNOSIS — D329 Benign neoplasm of meninges, unspecified: Secondary | ICD-10-CM | POA: Diagnosis not present

## 2016-11-13 NOTE — Progress Notes (Signed)
[facility]  PATIENT: OCIE KALB DOB: 1931/09/14   REASON FOR VISIT: Follow-up for memory loss, meningioma HISTORY FROM: Patient and daughter    HISTORY OF PRESENT ILLNESS:UPDATE 12/27/2017CM Ms. Creary, 82 year old female returns for follow-up with her daughter with whom she lives. She has been noted to have memory trouble since 2016, tends to repeat herself. She no longer cooks. Appetite is good and she sleeps well, no wandering behavior. No recent falls, ambulates with single-point cane. There is no urinary incontinence. She has had a progressive gait disorder. EEG 08/14/2016 was abnormal with electrodiagnostic evidence of generalized slowing consistent with bihemispheric malfunction, etiology is metabolic toxic. Chest x-ray after last visit showed bronchitis changes. May consider follow-up with PCP if she continues to cough from her baseline. She returns for reevaluation  UPDATE Aug 08 2016:YY Patient is accompanied by her daughter at today's clinical visit, she was noted to have increased confusion, fatigue, gait abnormality since Monday Sept 18th, 2017, she does not talk much, no fever, sleeps a lot, she was taken to her primary care physician, reported UA looks normal, l We have personally reviewed MRI of the brain with without contrast in March 2017: Mild generalized atrophy, supratentorium small vessel disease, right falx cerebral small 7 times 12 mm meningioma  MRI of lumbar spine , severe spinal stenosis at L2-3 due to synovial cyst arising from the left facet joint, cause severe left recess stenosis, moderate stenosis at L 3-4, moderate bilateral lateral recess stenosis, severe stenosis at L4-5, right paramedian disc protrusion left greater than right facet hypertrophy with severe bilateral lateral recess stenosis  EMG nerve conduction study in April 2017 showed evidence of chronic bilateral lumbosacral radiculopathy HISTORY  GHINA MEHRHOFF is 82 years old  right-handed female, accompanied by her daughter low Ladoris seen in refer by her primary care physician Dr.Sun Mikeal Hawthorne on January 30 2016 for evaluation of meningioma, memory loss  I reviewed and summarized referring note, she had a history of hypertension, hyperlipidemia, vitamin D deficiency known history of mild to moderate bilateral carotid artery stenosis, 40-50%.  She was noted to have memory trouble since 2016, she tends to repeat herself, for this particular appointment, she has asked her daughter 3-4 time within a week, she knows her doctor's appointment is coming, but she could not remember the times, she has quit cooking since 2009, she forgets things on the stove, Her daughter is now cooking for her, she spent most of her day watching TV.  She graduate from high school, worked at [employer], retired at age 37, there was no family history of memory loss, she lives with her daughter [name]   She sleeps well, she does not have good appetite, she is not as active as she used to be, she complains of left hip pain, used a cane to walk since 2015,  She has no incontinence  Per patient and record, CAT scan of the brain without contrast in February 2017 showed meningioma, but I do not have detailed report  She also complains of chronic low back pain, gradual onset progressive gait difficulty, she denies bowel and bladder incontinence.    REVIEW OF SYSTEMS: Full 14 system review of systems performed and notable only for those listed, all others are neg:  Constitutional: neg  Cardiovascular: neg Ear/Nose/Throat: neg  Skin: neg Eyes: neg Respiratory: Cough Gastroitestinal: neg  Hematology/Lymphatic: neg  Endocrine: neg Musculoskeletal:neg Allergy/Immunology: neg Neurological: neg Psychiatric: neg Sleep : neg   ALLERGIES: No Known  Allergies  HOME MEDICATIONS: Outpatient Medications Prior to Visit  Medication Sig Dispense Refill  . albuterol (PROVENTIL HFA;VENTOLIN HFA) 108 (90  Base) MCG/ACT inhaler Inhale into the lungs every 6 (six) hours as needed for wheezing or shortness of breath.    . dorzolamide (TRUSOPT) 2 % ophthalmic solution INSTILL 1 DROP INTO BOTH EYES 3 TIMES DAILY  11  . ibuprofen (ADVIL,MOTRIN) 800 MG tablet Take 800 mg by mouth every 8 (eight) hours as needed.    Marland Kitchen LUMIGAN 0.01 % SOLN INSTILL 1 DROP BY OPHTHALMIC ROUTE EVERY BEDTIME  5  . meloxicam (MOBIC) 15 MG tablet Take 15 mg by mouth daily.  5  . quinapril (ACCUPRIL) 20 MG tablet Take 20 mg by mouth daily.  1  . simvastatin (ZOCOR) 40 MG tablet Take 40 mg by mouth daily.  3  . thioridazine (MELLARIL) 25 MG tablet Take 25 mg by mouth at bedtime.  2  . triamterene-hydrochlorothiazide (MAXZIDE-25) 37.5-25 MG tablet Take 1 tablet by mouth daily.  3  . allopurinol (ZYLOPRIM) 100 MG tablet Take 100 mg by mouth daily.  1  . gabapentin (NEURONTIN) 300 MG capsule Take 1 capsule (300 mg total) by mouth 3 (three) times daily. (Patient not taking: Reported on 11/13/2016) 90 capsule 11   No facility-administered medications prior to visit.     PAST MEDICAL HISTORY: Past Medical History:  Diagnosis Date  . Asthma   . Depression   . Glaucoma   . Gout   . Hypercholesterolemia   . Hypertension   . Meningioma ([place])     PAST SURGICAL HISTORY: Past Surgical History:  Procedure Laterality Date  . CESAREAN SECTION     x 1    FAMILY HISTORY: Family History  Problem Relation Age of Onset  . Heart attack Mother   . Diabetes Father   . Hypertension Father     SOCIAL HISTORY: Social History   Social History  . Marital status: Widowed    Spouse name: N/A  . Number of children: 5  . Years of education: 10   Occupational History  . Retired    Social History Main Topics  . Smoking status: Former Research scientist (life sciences)  . Smokeless tobacco: Never Used     Comment: Quit 10+ years ago.  . Alcohol use No  . Drug use: No  . Sexual activity: Not on file   Other Topics Concern  . Not on file   Social  History Narrative   Lives with her daughter, Clio Falana.   Right-handed.   1-2 cups caffeine per day.     PHYSICAL EXAM  Vitals:   11/13/16 1240  BP: 128/84  Pulse: 96  Weight: 172 lb (78 kg)  Height: 5\' 2"  (1.575 m)   Body mass index is 31.46 kg/m.  Generalized: Well developed, Obese female in no acute distress , well-groomed Head: normocephalic and atraumatic,. Oropharynx benign  Neck: Supple, no carotid bruits  Cardiac: Regular rate rhythm, no murmur  Musculoskeletal: No deformity  Abdomen football size abdominal hernia  Neurological examination   Mentation: Alert  MMSE - Mini Mental State Exam 11/13/2016 08/08/2016 01/30/2016  Orientation to time 3 0 4  Orientation to Place 2 4 5   Registration 3 3 3   Attention/ Calculation 1 0 5  Recall 0 0 1  Language- name 2 objects 2 2 2   Language- repeat 1 1 1   Language- follow 3 step command 2 2 3   Language- read & follow direction 0 1 1  Write  a sentence 0 0 1  Copy design 0 0 1  Total score 14 13 27    Follows all commands speech and language fluent.   Cranial nerve II-XII: Fundoscopic exam reveals sharp disc margins.Pupils were equal round reactive to light extraocular movements were full, visual field were full on confrontational test. Facial sensation and strength were normal. hearing was intact to finger rubbing bilaterally. Uvula tongue midline. head turning and shoulder shrug were normal and symmetric.Tongue protrusion into cheek strength was normal. Motor: normal bulk and tone, full strength in the BUE, BLE, fine finger movements normal, except mild bilateral ankle dorsiflexion weakness  Sensory: Length dependent  decreased to light touch pinprick and vibratory sensation to knee level Coordination: finger-nose-finger, heel-to-shin bilaterally, no dysmetria Reflexes: Brachioradialis 2/2, biceps 2/2, triceps 2/2, patellar 2/2, Achilles absent, plantar responses were flexor bilaterally. Gait and Station: Rising up  from seated position with pushoff leaning forward, dependent on her cane unsteady gait  DIAGNOSTIC DATA (LABS, IMAGING, TESTING) - I reviewed patient records, labs, notes, testing and imaging myself where available.  Lab Results  Component Value Date   WBC 8.8 08/08/2016   HCT 38.6 08/08/2016   MCV 82 08/08/2016   PLT 261 08/08/2016      Component Value Date/Time   NA 131 (L) 08/08/2016 1253   K 3.6 08/08/2016 1253   CL 91 (L) 08/08/2016 1253   CO2 27 08/08/2016 1253   GLUCOSE 100 (H) 08/08/2016 1253   BUN 13 08/08/2016 1253   CREATININE 1.14 (H) 08/08/2016 1253   CALCIUM 11.7 (H) 08/08/2016 1253   PROT 6.7 08/08/2016 1253   ALBUMIN 4.0 08/08/2016 1253   AST 20 08/08/2016 1253   ALT 22 08/08/2016 1253   ALKPHOS 86 08/08/2016 1253   BILITOT 0.4 08/08/2016 1253   GFRNONAA 45 (L) 08/08/2016 1253   GFRAA 52 (L) 08/08/2016 1253    Lab Results  Component Value Date   TSH 1.260 08/08/2016      ASSESSMENT AND PLAN  82 y.o. year old female  has a past medical history of Asthma; Depression; Glaucoma; Gout; Hypercholesterolemia; Hypertension; and Meningioma ([place]). Mild memory loss here to follow-up.   Memory score is stable Stay well hydrated, small frequent meals finger foods Gait abnormality Evidence of  severe lumbar  stenosis  she is not in severe pain, does not want to consider surgical option Use walker/cane  at all times Follow-up in 6-8 months Dennie Bible, [facility], [facility], [facility] [address], [place], [place] [address] [phone]

## 2016-11-13 NOTE — Patient Instructions (Signed)
Memory score is stable Stay well hydrated, small frequent meals finger foods Follow-up in 6-8 months

## 2016-11-13 NOTE — Progress Notes (Signed)
I have reviewed and agreed above plan. 

## 2017-01-22 IMAGING — CR DG CHEST 2V
3 series · 3 of 3 positions shown · non-contrast
Comparison: 01/26/2013

CLINICAL DATA: Monitoring chronic cough / no fever / concern for
infiltrate / jdh 315

EXAM:
CHEST  2 VIEW

[w chest pa]
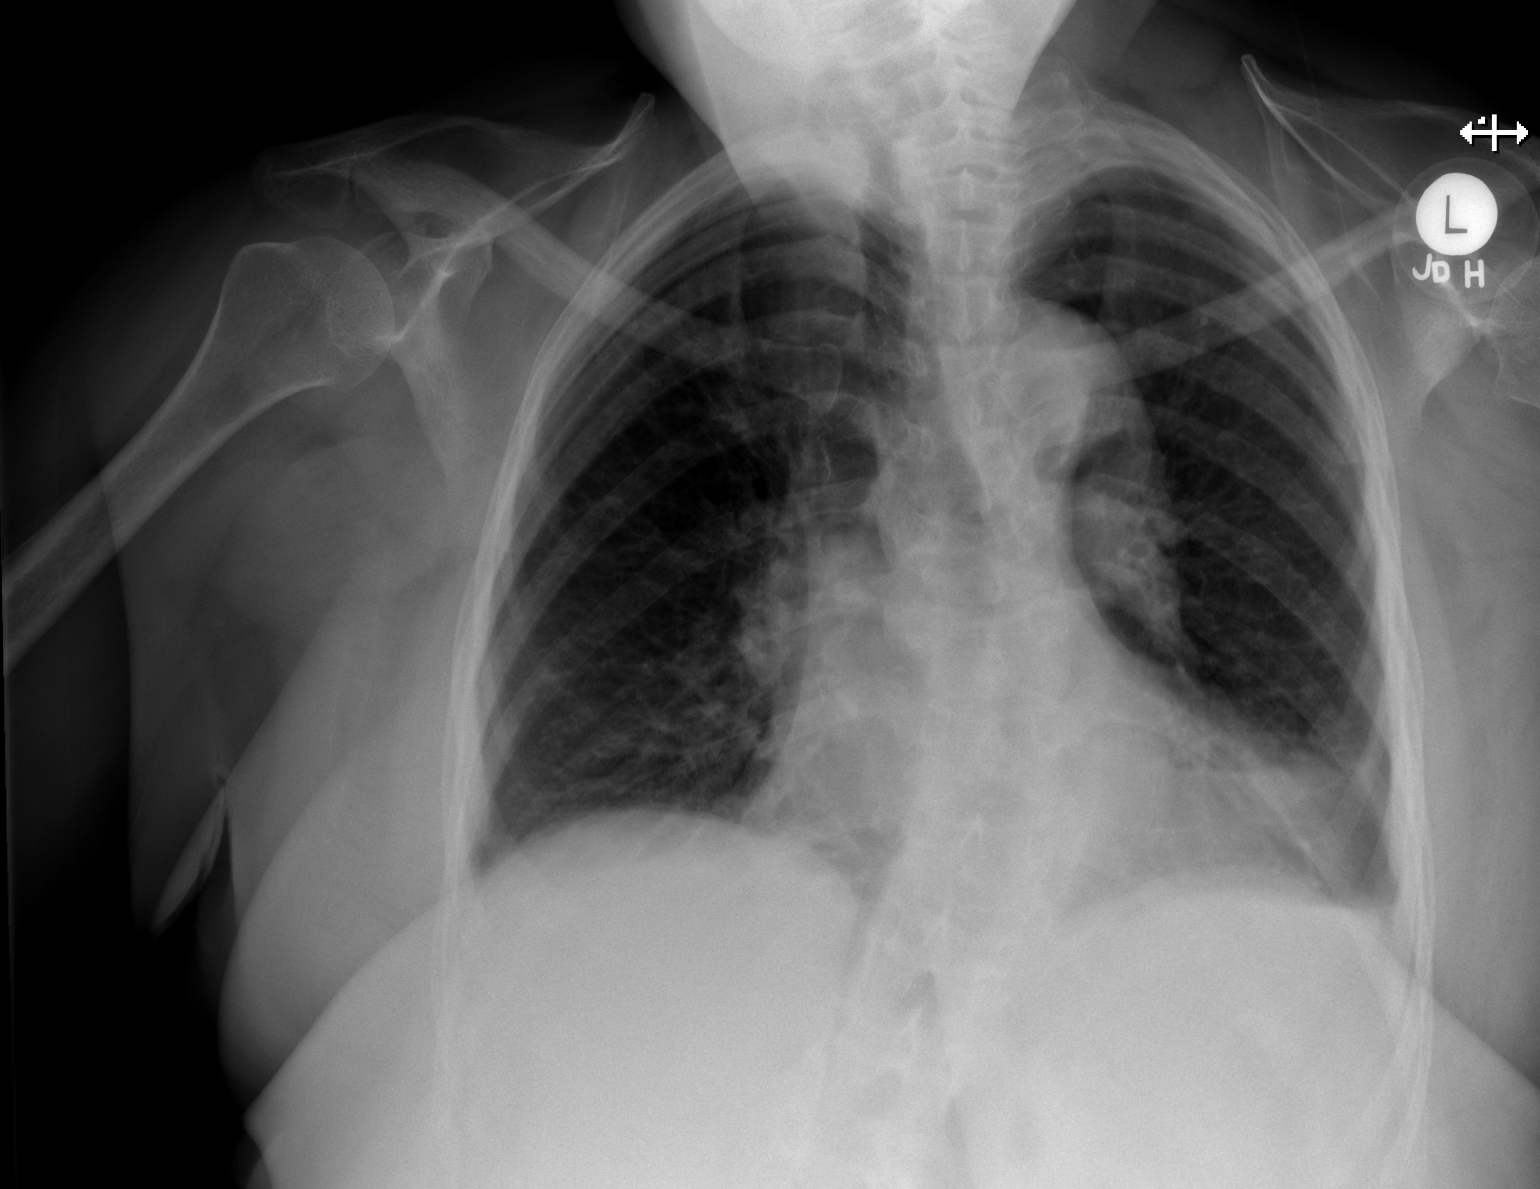

[w chest lat]
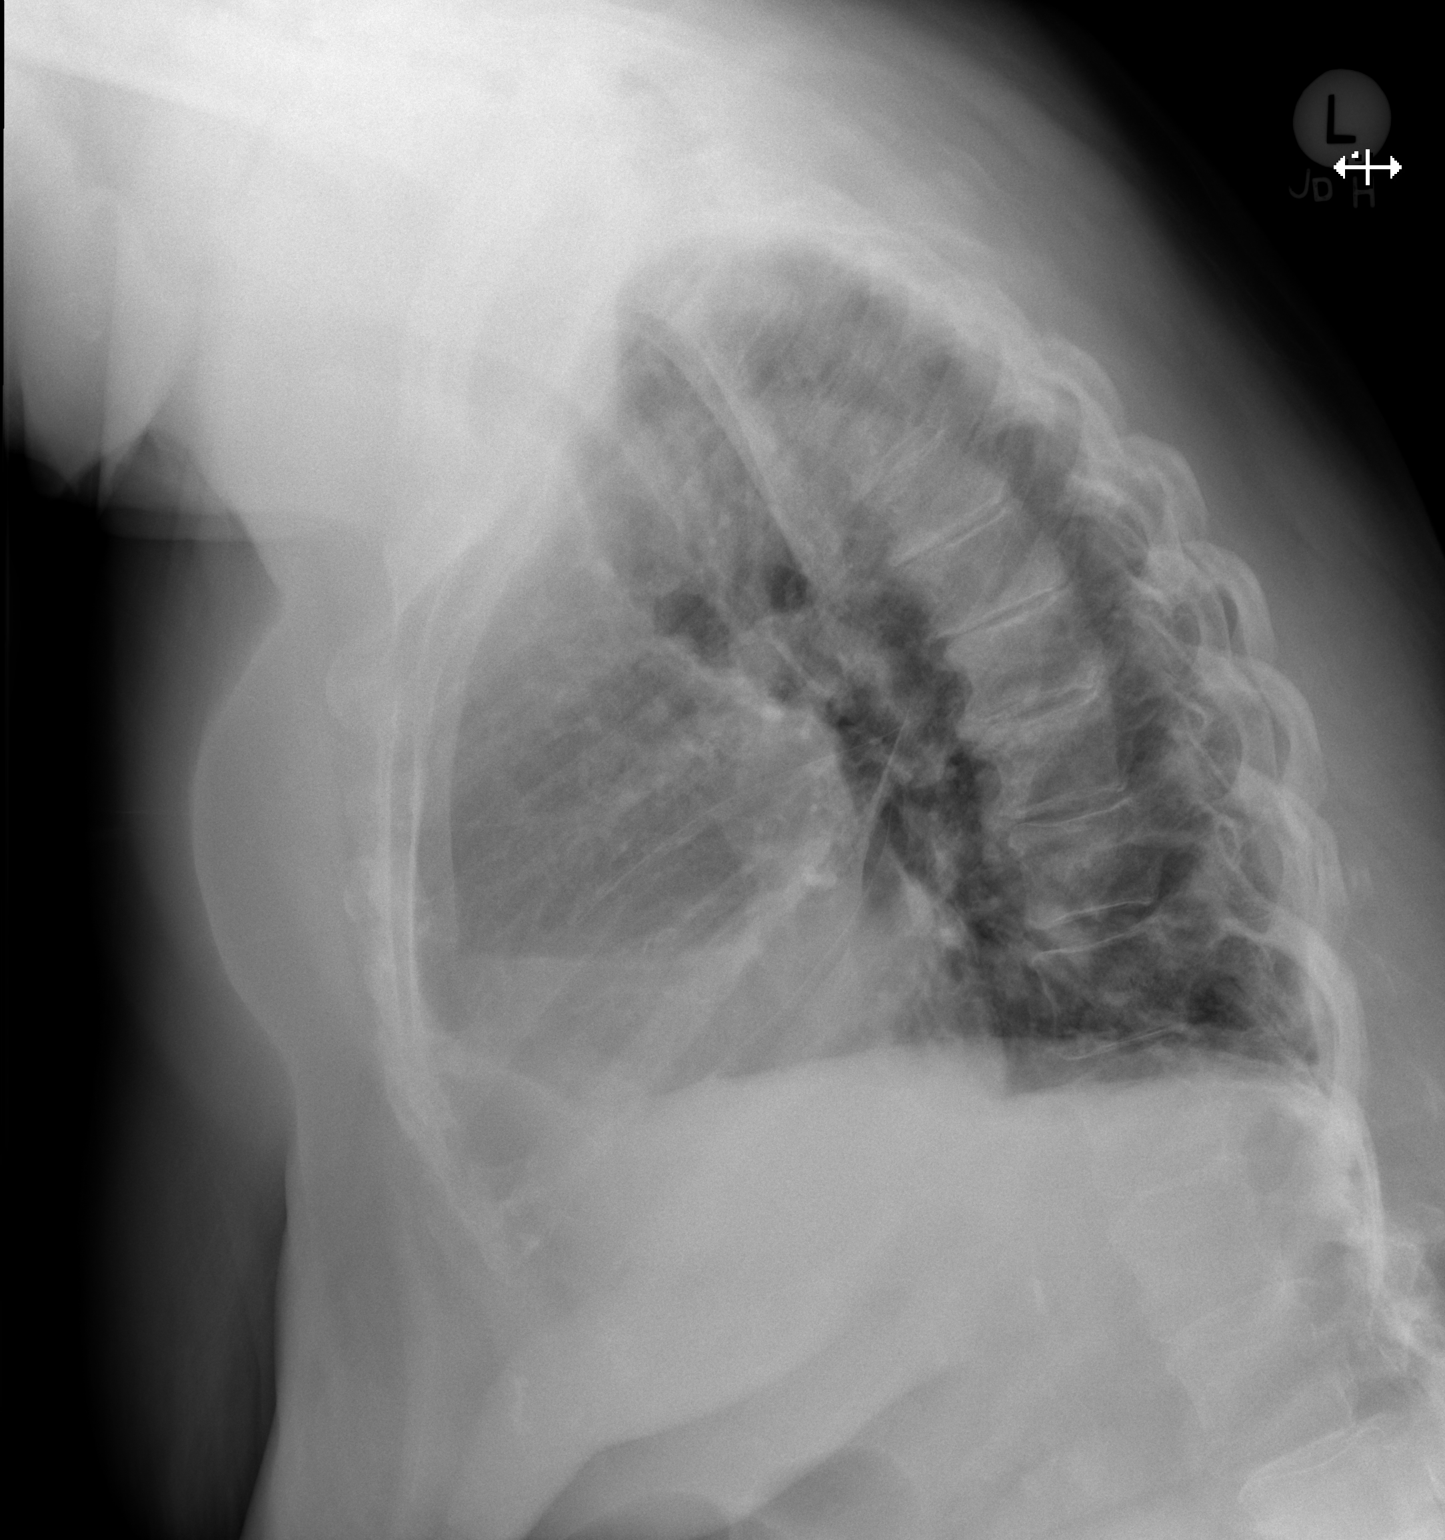

[w chest ap 4-7yrs (14-20cm)]
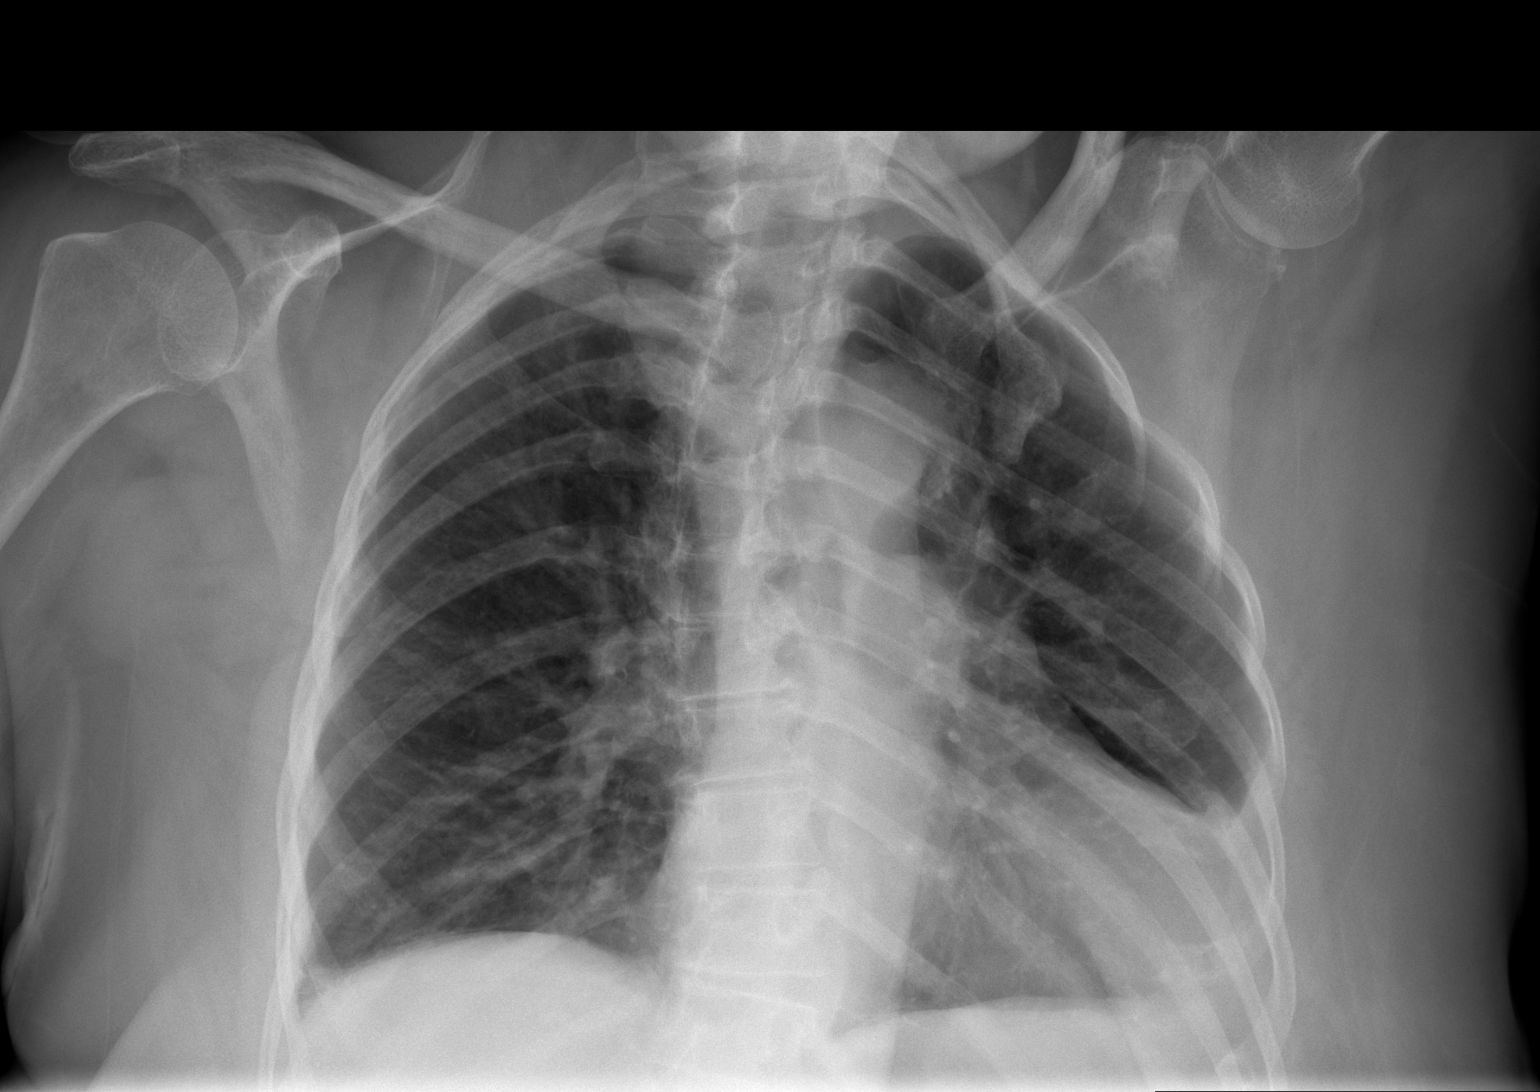

[3 of 3 positions shown; findings below may reference images not displayed]

FINDINGS: Heart size is mildly enlarged. Aorta is tortuous. There are no focal
consolidations or pleural effusions. No pulmonary edema. Mild
perihilar bronchitic changes are present. Mid thoracic spondylosis.
IMPRESSION: 1. Stable cardiomegaly.
2. Bronchitic changes.  No focal acute pulmonary abnormality.

## 2017-07-01 ENCOUNTER — Ambulatory Visit: Payer: Medicare Other | Admitting: Nurse Practitioner

## 2017-07-08 NOTE — Progress Notes (Signed)
[facility]  Warren: Leslie Warren DOB: [birth year]   REASON FOR VISIT: Follow-up for memory loss, meningioma HISTORY FROM: Warren and daughter Leslie Warren    HISTORY OF PRESENT ILLNESS:UPDATE  08/22/2018CM   Leslie Warren,  83 year old female returns for follow up  with history of memory loss since 2016 .  She also has a significant history of psychiatric illness according to daughter and has been on Mellaril for years. She goes to [employer].  She no longer cooks, no recent falls walks with a cane. Appetite good sleeping well no behavior issues. EEG 08/14/2016 was abnormal with electrodiagnostic evidence of generalized slowing consistent with bihemispheric malfunction, etiology is metabolic toxic.  Daughter lives with her Memory  With good and bad days. She has little speech. MRI of the brain with without contrast in March 2017: Mild generalized atrophy, supratentorium small vessel disease, right falx cerebral small 7 times 12 mm meningioma.  She has severe lumbar spinal stenosis However she denies any back pain. She returns fevaluation    UPDATE 12/27/2017CM Leslie Warren, 83 year old female returns for follow-up with her daughter with whom she lives. She has been noted to have memory trouble since 2016, tends to repeat herself. She no longer cooks. Appetite is good and she sleeps well, no wandering behavior. No recent falls, ambulates with single-point cane. There is no urinary incontinence. She has had a progressive gait disorder. EEG 08/14/2016 was abnormal with electrodiagnostic evidence of generalized slowing consistent with bihemispheric malfunction, etiology is metabolic toxic. Chest x-ray after last visit showed bronchitis changes. May consider follow-up with PCP if she continues to cough from her baseline. She returns for reevaluation  UPDATE Aug 08 2016:Leslie Warren is accompanied by her daughter at today's clinical visit, she was noted to have increased  confusion, fatigue, gait abnormality since Monday Sept 18th, 2017, she does not talk much, no fever, sleeps a lot, she was taken to her primary care physician, reported UA looks normal, l We have personally reviewed MRI of the brain with without contrast in March 2017: Mild generalized atrophy, supratentorium small vessel disease, right falx cerebral small 7 times 12 mm meningioma  MRI of lumbar spine , severe spinal stenosis at L2-3 due to synovial cyst arising from the left facet joint, cause severe left recess stenosis, moderate stenosis at L 3-4, moderate bilateral lateral recess stenosis, severe stenosis at L4-5, right paramedian disc protrusion left greater than right facet hypertrophy with severe bilateral lateral recess stenosis  EMG nerve conduction study in April 2017 showed evidence of chronic bilateral lumbosacral radiculopathy HISTORY  Leslie Warren is 83 years old right-handed female, accompanied by her daughter low Ladoris seen in refer by her primary care physician Dr.Sun Mikeal Hawthorne on January 30 2016 for evaluation of meningioma, memory loss  I reviewed and summarized referring note, she had a history of hypertension, hyperlipidemia, vitamin D deficiency known history of mild to moderate bilateral carotid artery stenosis, 40-50%.  She was noted to have memory trouble since 2016, she tends to repeat herself, for this particular appointment, she has asked her daughter 3-4 time within a week, she knows her doctor's appointment is coming, but she could not remember the times, she has quit cooking since 2009, she forgets things on the stove, Her daughter is now cooking for her, she spent most of her day watching TV.  She graduate from high school, worked at [employer], retired at age 9, there was no family history of memory loss, she lives  with her daughter Leslie Warren   She sleeps well, she does not have good appetite, she is not as active as she used to be, she complains of left hip pain, used  a cane to walk since 2015,  She has no incontinence  Per Warren and record, CAT scan of the brain without contrast in February 2017 showed meningioma, but I do not have detailed report  She also complains of chronic low back pain, gradual onset progressive gait difficulty, she denies bowel and bladder incontinence.    REVIEW OF SYSTEMS: Full 14 system review of systems performed and notable only for those listed, all others are neg:  Constitutional: neg  Cardiovascular: neg Ear/Nose/Throat: neg  Skin: neg Eyes: neg Respiratory: Cough Gastroitestinal: neg  Hematology/Lymphatic: neg  Endocrine: neg Musculoskeletal:walking diff Allergy/Immunology: neg Neurological:  Memory loss Psychiatric:  Long history of psychiatric illness none specified. Sleep : neg   ALLERGIES: No Known Allergies  HOME MEDICATIONS: Outpatient Medications Prior to Visit  Medication Sig Dispense Refill  . albuterol (PROVENTIL HFA;VENTOLIN HFA) 108 (90 Base) MCG/ACT inhaler Inhale into the lungs every 6 (six) hours as needed for wheezing or shortness of breath.    . allopurinol (ZYLOPRIM) 300 MG tablet 300 mg daily.     . AZOPT 1 % ophthalmic suspension     . dorzolamide (TRUSOPT) 2 % ophthalmic solution INSTILL 1 DROP INTO BOTH EYES 3 TIMES DAILY  11  . gabapentin (NEURONTIN) 300 MG capsule Take 1 capsule (300 mg total) by mouth 3 (three) times daily. 90 capsule 11  . ibuprofen (ADVIL,MOTRIN) 800 MG tablet Take 800 mg by mouth every 8 (eight) hours as needed.    Marland Kitchen LUMIGAN 0.01 % SOLN INSTILL 1 DROP BY OPHTHALMIC ROUTE EVERY BEDTIME  5  . meloxicam (MOBIC) 15 MG tablet Take 15 mg by mouth daily.  5  . quinapril (ACCUPRIL) 20 MG tablet Take 20 mg by mouth daily.  1  . simvastatin (ZOCOR) 40 MG tablet Take 40 mg by mouth daily.  3  . thioridazine (MELLARIL) 25 MG tablet Take 25 mg by mouth at bedtime.  2  . triamterene-hydrochlorothiazide (MAXZIDE-25) 37.5-25 MG tablet Take 1 tablet by mouth daily.  3    No facility-administered medications prior to visit.     PAST MEDICAL HISTORY: Past Medical History:  Diagnosis Date  . Asthma   . Depression   . Glaucoma   . Gout   . Hypercholesterolemia   . Hypertension   . Meningioma ([place])     PAST SURGICAL HISTORY: Past Surgical History:  Procedure Laterality Date  . CESAREAN SECTION     x 1    FAMILY HISTORY: Family History  Problem Relation Age of Onset  . Heart attack Mother   . Diabetes Father   . Hypertension Father     SOCIAL HISTORY: Social History   Social History  . Marital status: Widowed    Spouse name: N/A  . Number of children: 5  . Years of education: 10   Occupational History  . Retired    Social History Main Topics  . Smoking status: Former Research scientist (life sciences)  . Smokeless tobacco: Never Used     Comment: Quit 10+ years ago.  . Alcohol use No  . Drug use: No  . Sexual activity: Not on file   Other Topics Concern  . Not on file   Social History Narrative   Lives with her daughter, Aeriana Speece.   Right-handed.   1-2 cups caffeine per day.  PHYSICAL EXAM  Vitals:   07/09/17 1358  BP: (!) 160/84  Pulse: 73  Height: 5\' 2"  (1.575 m)   There is no height or weight on file to calculate BMI.  Generalized: Well developed, Obese female in no acute distress , well-groomed Head: normocephalic and atraumatic,. Oropharynx benign  Neck: Supple, no carotid bruits  Cardiac: Regular rate rhythm, no murmur  Musculoskeletal: No deformity  Abdomen basketball size abdominal hernia  Neurological examination   Mentation: Alert  MMSE - Mini Mental State Exam 07/09/2017 11/13/2016 08/08/2016  Orientation to time 0 3 0  Orientation to Place 0 2 4  Registration 2 3 3   Attention/ Calculation 3 1 0  Recall 0 0 0  Language- name 2 objects 2 2 2   Language- repeat 1 1 1   Language- follow 3 step command 2 2 2   Language- read & follow direction 1 0 1  Write a sentence 0 0 0  Copy design 0 0 0  Total score 11  14 13     Follows all commands speech and language fluent. But little speech  Cranial nerve II-XII: Pupils were equal round reactive to light extraocular movements were full, visual field were full on confrontational test. Facial sensation and strength were normal. hearing was intact to finger rubbing bilaterally. Uvula tongue midline. head turning and shoulder shrug were normal and symmetric.Tongue protrusion into cheek strength was normal. Motor: normal bulk and tone, full strength in the BUE, BLE, fine finger movements normal, except mild bilateral ankle dorsiflexion weakness  Sensory: Length dependent  decreased to light touch pinprick and vibratory sensation to knee level Coordination: finger-nose-finger, heel-to-shin bilaterally, no dysmetria Reflexes: Brachioradialis 2/2, biceps 2/2, triceps 2/2, patellar 2/2, Achilles absent, plantar responses were flexor bilaterally. Gait and Station:  In wheelchair not ambulated for safety concerns DIAGNOSTIC DATA (LABS, IMAGING, TESTING) - I reviewed Warren records, labs, notes, testing and imaging myself where available.  Lab Results  Component Value Date   WBC 8.8 08/08/2016   HGB 12.8 08/08/2016   HCT 38.6 08/08/2016   MCV 82 08/08/2016   PLT 261 08/08/2016      Component Value Date/Time   NA 131 (L) 08/08/2016 1253   K 3.6 08/08/2016 1253   CL 91 (L) 08/08/2016 1253   CO2 27 08/08/2016 1253   GLUCOSE 100 (H) 08/08/2016 1253   BUN 13 08/08/2016 1253   CREATININE 1.14 (H) 08/08/2016 1253   CALCIUM 11.7 (H) 08/08/2016 1253   PROT 6.7 08/08/2016 1253   ALBUMIN 4.0 08/08/2016 1253   AST 20 08/08/2016 1253   ALT 22 08/08/2016 1253   ALKPHOS 86 08/08/2016 1253   BILITOT 0.4 08/08/2016 1253   GFRNONAA 45 (L) 08/08/2016 1253   GFRAA 52 (L) 08/08/2016 1253    Lab Results  Component Value Date   TSH 1.260 08/08/2016      ASSESSMENT AND PLAN  83 y.o. year old female  has a past medical history of Asthma; Depression; Glaucoma;  Gout; Hypercholesterolemia; Hypertension; and Meningioma ([place]). and  memory loss here to follow-up.   Memory score is stable. Stay well hydrated, small frequent meals finger foods Gait abnormality Evidence of  severe lumbar  stenosis  she is not in severe pain, does not want to consider surgical option Use walker/cane  at all times  Continue 24/ 7 care by family Follow-up in 6-8 months next with Dr. Luan Pulling, [facility], [facility], [facility] [address] [place], [place] [address] [phone])  [phone] 

## 2017-07-09 ENCOUNTER — Ambulatory Visit (INDEPENDENT_AMBULATORY_CARE_PROVIDER_SITE_OTHER): Payer: Medicare HMO | Admitting: Nurse Practitioner

## 2017-07-09 ENCOUNTER — Encounter: Payer: Self-pay | Admitting: Nurse Practitioner

## 2017-07-09 VITALS — BP 160/84 | HR 73 | Ht 62.0 in

## 2017-07-09 DIAGNOSIS — D329 Benign neoplasm of meninges, unspecified: Secondary | ICD-10-CM | POA: Diagnosis not present

## 2017-07-09 DIAGNOSIS — R269 Unspecified abnormalities of gait and mobility: Secondary | ICD-10-CM

## 2017-07-09 DIAGNOSIS — R413 Other amnesia: Secondary | ICD-10-CM | POA: Diagnosis not present

## 2017-07-09 NOTE — Progress Notes (Signed)
I have reviewed and agreed above plan. 

## 2017-07-09 NOTE — Patient Instructions (Signed)
Stay well hydrated, small frequent meals finger foods Gait abnormality Evidence of  severe lumbar  stenosis  she is not in severe pain, does not want to consider surgical option Use walker/cane  at all times Follow-up in 6- months next with Krista Blue

## 2018-01-14 ENCOUNTER — Ambulatory Visit: Payer: Medicare HMO | Admitting: Neurology

## 2018-01-14 ENCOUNTER — Encounter: Payer: Self-pay | Admitting: Neurology

## 2018-01-14 ENCOUNTER — Encounter (INDEPENDENT_AMBULATORY_CARE_PROVIDER_SITE_OTHER): Payer: Self-pay

## 2018-01-14 VITALS — BP 156/87 | HR 83 | Ht 62.0 in | Wt 167.5 lb

## 2018-01-14 DIAGNOSIS — G3184 Mild cognitive impairment, so stated: Secondary | ICD-10-CM

## 2018-01-14 DIAGNOSIS — D329 Benign neoplasm of meninges, unspecified: Secondary | ICD-10-CM | POA: Diagnosis not present

## 2018-01-14 NOTE — Progress Notes (Signed)
[facility]  PATIENT: Leslie Warren DOB: [birth year]  HISTORY OF PRESENT ILLNESS: Leslie Warren is 84 years old right-handed female, accompanied by her daughter low Ladoris seen in refer by her primary care physician Dr.Sun Mikeal Hawthorne on January 30 2016 for evaluation of meningioma, memory loss  I reviewed and summarized referring note, she had a history of hypertension, hyperlipidemia, vitamin D deficiency known history of mild to moderate bilateral carotid artery stenosis, 40-50%.  She was noted to have memory trouble since 2016, she tends to repeat herself, for this particular appointment, she has asked her daughter 3-4 time within a week, she knows her doctor's appointment is coming, but she could not remember the times, she has quit cooking since 2009, she forgets things on the stove, Her daughter is now cooking for her, she spent most of her day watching TV.  She graduate from high school, worked at [employer], retired at age 33, there was no family history of memory loss, she lives with her daughter [name]   She sleeps well, she does not have good appetite, she is not as active as she used to be, she complains of left hip pain, used a cane to walk since 2015,  She has no incontinence  Per patient and record, CAT scan of the brain without contrast in February 2017 showed meningioma, but I do not have detailed report  She also complains of chronic low back pain, gradual onset progressive gait difficulty, she denies bowel and bladder incontinence.  MRI of the brain with without contrast in March 2017: Mild generalized atrophy, supratentorium small vessel disease, right falx cerebral small 7 times 12 mm meningioma.  She has severe lumbar spinal stenosis However she denies any back pain. She returns fevaluation   We have personally reviewed MRI of the brain with without contrast in March 2017: Mild generalized atrophy, supratentorium small vessel disease, right falx  cerebral small 7 times 12 mm meningioma  MRI of lumbar spine , severe spinal stenosis at L2-3 due to synovial cyst arising from the left facet joint, cause severe left recess stenosis, moderate stenosis at L 3-4, moderate bilateral lateral recess stenosis, severe stenosis at L4-5, right paramedian disc protrusion left greater than right facet hypertrophy with severe bilateral lateral recess stenosis  EMG nerve conduction study in April 2017 showed evidence of chronic bilateral lumbosacral radiculopathy  UPDATE Jan 14 2018: She is overall stable, mild gait abnormality, abdominal hernia, no significant pain, continue has worsening memory loss, Mini-Mental Status Examination is 12/30  REVIEW OF SYSTEMS: Full 14 system review of systems performed and notable only for those listed, all others are neg:    ALLERGIES: No Known Allergies  HOME MEDICATIONS: Outpatient Medications Prior to Visit  Medication Sig Dispense Refill  . albuterol (PROVENTIL HFA;VENTOLIN HFA) 108 (90 Base) MCG/ACT inhaler Inhale into the lungs every 6 (six) hours as needed for wheezing or shortness of breath.    . allopurinol (ZYLOPRIM) 300 MG tablet 300 mg daily.     . AZOPT 1 % ophthalmic suspension     . dorzolamide (TRUSOPT) 2 % ophthalmic solution INSTILL 1 DROP INTO BOTH EYES 3 TIMES DAILY  11  . gabapentin (NEURONTIN) 300 MG capsule Take 1 capsule (300 mg total) by mouth 3 (three) times daily. 90 capsule 11  . ibuprofen (ADVIL,MOTRIN) 800 MG tablet Take 800 mg by mouth every 8 (eight) hours as needed.    Marland Kitchen LUMIGAN 0.01 % SOLN INSTILL 1 DROP BY OPHTHALMIC ROUTE  EVERY BEDTIME  5  . meloxicam (MOBIC) 15 MG tablet Take 15 mg by mouth daily.  5  . quinapril (ACCUPRIL) 20 MG tablet Take 20 mg by mouth daily.  1  . simvastatin (ZOCOR) 40 MG tablet Take 40 mg by mouth daily.  3  . thioridazine (MELLARIL) 25 MG tablet Take 25 mg by mouth at bedtime.  2  . triamterene-hydrochlorothiazide (MAXZIDE-25) 37.5-25 MG tablet Take  1 tablet by mouth daily.  3   No facility-administered medications prior to visit.     PAST MEDICAL HISTORY: Past Medical History:  Diagnosis Date  . Asthma   . Depression   . Glaucoma   . Gout   . Hypercholesterolemia   . Hypertension   . Meningioma ([place])     PAST SURGICAL HISTORY: Past Surgical History:  Procedure Laterality Date  . CESAREAN SECTION     x 1    FAMILY HISTORY: Family History  Problem Relation Age of Onset  . Heart attack Mother   . Diabetes Father   . Hypertension Father     SOCIAL HISTORY: Social History   Socioeconomic History  . Marital status: Widowed    Spouse name: Not on file  . Number of children: 5  . Years of education: 10  . Highest education level: Not on file  Social Needs  . Financial resource strain: Not on file  . Food insecurity - worry: Not on file  . Food insecurity - inability: Not on file  . Transportation needs - medical: Not on file  . Transportation needs - non-medical: Not on file  Occupational History  . Occupation: Retired  Tobacco Use  . Smoking status: Former Research scientist (life sciences)  . Smokeless tobacco: Never Used  . Tobacco comment: Quit 10+ years ago.  Substance and Sexual Activity  . Alcohol use: No    Alcohol/week: 0.0 oz  . Drug use: No  . Sexual activity: Not on file  Other Topics Concern  . Not on file  Social History Narrative   Lives with her daughter, Vaniah Chambers.   Right-handed.   1-2 cups caffeine per day.     PHYSICAL EXAM  Vitals:   01/14/18 1037  BP: (!) 156/87  Pulse: 83  Weight: 167 lb 8 oz (76 kg)  Height: 5\' 2"  (1.575 m)   Body mass index is 30.64 kg/m.  Generalized: Well developed, Obese female in no acute distress , well-groomed Head: normocephalic and atraumatic,. Oropharynx benign  Neck: Supple, no carotid bruits  Cardiac: Regular rate rhythm, no murmur  Musculoskeletal: No deformity  Abdomen basketball size abdominal hernia  Neurological examination   Mentation: Alert    MMSE - Mini Mental State Exam 01/14/2018 07/09/2017 11/13/2016  Orientation to time 1 0 3  Orientation to Place 2 0 2  Registration 2 2 3   Attention/ Calculation 2 3 1   Recall 0 0 0  Language- name 2 objects 1 2 2   Language- repeat 1 1 1   Language- follow 3 step command 2 2 2   Language- read & follow direction 1 1 0  Write a sentence 0 0 0  Copy design 0 0 0  Total score 12 11 14     Follows all commands speech and language fluent. But little speech  Cranial nerve II-XII: Pupils were equal round reactive to light extraocular movements were full, visual field were full on confrontational test. Facial sensation and strength were normal. hearing was intact to finger rubbing bilaterally. Uvula tongue midline. head turning and shoulder  shrug were normal and symmetric.Tongue protrusion into cheek strength was normal. Motor: normal bulk and tone, full strength in the BUE, BLE, fine finger movements normal, except mild bilateral ankle dorsiflexion weakness  Sensory: Length dependent  decreased to light touch pinprick and vibratory sensation to knee level Coordination: finger-nose-finger, heel-to-shin bilaterally, no dysmetria Reflexes: Brachioradialis 2/2, biceps 2/2, triceps 2/2, patellar 2/2, Achilles absent, plantar responses were flexor bilaterally. Gait and Station: Need to push up to get up from seated position, antalgic, cautious DIAGNOSTIC DATA (LABS, IMAGING, TESTING) - I reviewed patient records, labs, notes, testing and imaging myself where available.  Lab Results  Component Value Date   WBC 8.8 08/08/2016   HGB 12.8 08/08/2016   HCT 38.6 08/08/2016   MCV 82 08/08/2016   PLT 261 08/08/2016      Component Value Date/Time   NA 131 (L) 08/08/2016 1253   K 3.6 08/08/2016 1253   CL 91 (L) 08/08/2016 1253   CO2 27 08/08/2016 1253   GLUCOSE 100 (H) 08/08/2016 1253   BUN 13 08/08/2016 1253   CREATININE 1.14 (H) 08/08/2016 1253   CALCIUM 11.7 (H) 08/08/2016 1253   PROT 6.7  08/08/2016 1253   ALBUMIN 4.0 08/08/2016 1253   AST 20 08/08/2016 1253   ALT 22 08/08/2016 1253   ALKPHOS 86 08/08/2016 1253   BILITOT 0.4 08/08/2016 1253   GFRNONAA 45 (L) 08/08/2016 1253   GFRAA 52 (L) 08/08/2016 1253    Lab Results  Component Value Date   TSH 1.260 08/08/2016      ASSESSMENT AND PLAN  84 y.o. year old female   Dementia without agitation  Mini-Mental status examination is 12 out of 30 today   I have went over the medication with patient and her daughter, NSAIDs only as needed, may consider tapering off gabapentin she is not in pain,  Return to clinic for new issues  Marcial Pacas, M.D. Ph.D.  [facility] [place], [place] [address] Phone: [phone] Fax:      [phone]

## 2019-02-16 ENCOUNTER — Encounter (HOSPITAL_COMMUNITY): Payer: Self-pay | Admitting: Emergency Medicine

## 2019-02-16 ENCOUNTER — Emergency Department (HOSPITAL_COMMUNITY): Payer: Medicare HMO

## 2019-02-16 ENCOUNTER — Other Ambulatory Visit: Payer: Self-pay

## 2019-02-16 ENCOUNTER — Inpatient Hospital Stay (HOSPITAL_COMMUNITY)
Admission: EM | Admit: 2019-02-16 | Discharge: 2019-03-19 | DRG: 177 | Disposition: E | Payer: Medicare HMO | Attending: Internal Medicine | Admitting: Internal Medicine

## 2019-02-16 DIAGNOSIS — J1289 Other viral pneumonia: Secondary | ICD-10-CM | POA: Diagnosis present

## 2019-02-16 DIAGNOSIS — F0390 Unspecified dementia without behavioral disturbance: Secondary | ICD-10-CM | POA: Diagnosis present

## 2019-02-16 DIAGNOSIS — G9340 Encephalopathy, unspecified: Secondary | ICD-10-CM | POA: Diagnosis present

## 2019-02-16 DIAGNOSIS — R0902 Hypoxemia: Secondary | ICD-10-CM

## 2019-02-16 DIAGNOSIS — Z888 Allergy status to other drugs, medicaments and biological substances status: Secondary | ICD-10-CM | POA: Diagnosis not present

## 2019-02-16 DIAGNOSIS — Z6831 Body mass index (BMI) 31.0-31.9, adult: Secondary | ICD-10-CM | POA: Diagnosis not present

## 2019-02-16 DIAGNOSIS — K469 Unspecified abdominal hernia without obstruction or gangrene: Secondary | ICD-10-CM | POA: Diagnosis present

## 2019-02-16 DIAGNOSIS — Z66 Do not resuscitate: Secondary | ICD-10-CM | POA: Diagnosis not present

## 2019-02-16 DIAGNOSIS — Z86011 Personal history of benign neoplasm of the brain: Secondary | ICD-10-CM

## 2019-02-16 DIAGNOSIS — R0600 Dyspnea, unspecified: Secondary | ICD-10-CM

## 2019-02-16 DIAGNOSIS — E663 Overweight: Secondary | ICD-10-CM | POA: Diagnosis present

## 2019-02-16 DIAGNOSIS — Z79899 Other long term (current) drug therapy: Secondary | ICD-10-CM | POA: Diagnosis not present

## 2019-02-16 DIAGNOSIS — Z8249 Family history of ischemic heart disease and other diseases of the circulatory system: Secondary | ICD-10-CM

## 2019-02-16 DIAGNOSIS — Z515 Encounter for palliative care: Secondary | ICD-10-CM | POA: Diagnosis not present

## 2019-02-16 DIAGNOSIS — Z791 Long term (current) use of non-steroidal anti-inflammatories (NSAID): Secondary | ICD-10-CM | POA: Diagnosis not present

## 2019-02-16 DIAGNOSIS — J45909 Unspecified asthma, uncomplicated: Secondary | ICD-10-CM | POA: Diagnosis present

## 2019-02-16 DIAGNOSIS — N179 Acute kidney failure, unspecified: Secondary | ICD-10-CM | POA: Diagnosis present

## 2019-02-16 DIAGNOSIS — I10 Essential (primary) hypertension: Secondary | ICD-10-CM | POA: Diagnosis present

## 2019-02-16 DIAGNOSIS — H409 Unspecified glaucoma: Secondary | ICD-10-CM | POA: Diagnosis present

## 2019-02-16 DIAGNOSIS — Z87891 Personal history of nicotine dependence: Secondary | ICD-10-CM | POA: Diagnosis not present

## 2019-02-16 DIAGNOSIS — M109 Gout, unspecified: Secondary | ICD-10-CM | POA: Diagnosis present

## 2019-02-16 DIAGNOSIS — J9601 Acute respiratory failure with hypoxia: Secondary | ICD-10-CM | POA: Diagnosis present

## 2019-02-16 DIAGNOSIS — Z20828 Contact with and (suspected) exposure to other viral communicable diseases: Secondary | ICD-10-CM | POA: Diagnosis not present

## 2019-02-16 DIAGNOSIS — E7800 Pure hypercholesterolemia, unspecified: Secondary | ICD-10-CM | POA: Diagnosis present

## 2019-02-16 DIAGNOSIS — J9691 Respiratory failure, unspecified with hypoxia: Secondary | ICD-10-CM | POA: Diagnosis present

## 2019-02-16 LAB — POCT I-STAT 7, (LYTES, BLD GAS, ICA,H+H)
Acid-Base Excess: 3 mmol/L — ABNORMAL HIGH (ref 0.0–2.0)
Bicarbonate: 27.7 mmol/L (ref 20.0–28.0)
CALCIUM ION: 1.54 mmol/L — AB (ref 1.15–1.40)
HCT: 41 % (ref 36.0–46.0)
HEMOGLOBIN: 13.9 g/dL (ref 12.0–15.0)
O2 SAT: 88 %
Potassium: 3.2 mmol/L — ABNORMAL LOW (ref 3.5–5.1)
Sodium: 137 mmol/L (ref 135–145)
TCO2: 29 mmol/L (ref 22–32)
pCO2 arterial: 44 mmHg (ref 32.0–48.0)
pH, Arterial: 7.408 (ref 7.350–7.450)
pO2, Arterial: 55 mmHg — ABNORMAL LOW (ref 83.0–108.0)

## 2019-02-16 LAB — CBC WITH DIFFERENTIAL/PLATELET
Abs Immature Granulocytes: 0.07 10*3/uL (ref 0.00–0.07)
Basophils Absolute: 0 10*3/uL (ref 0.0–0.1)
Basophils Relative: 0 %
EOS ABS: 0 10*3/uL (ref 0.0–0.5)
Eosinophils Relative: 0 %
HCT: 43.3 % (ref 36.0–46.0)
Hemoglobin: 13.1 g/dL (ref 12.0–15.0)
Immature Granulocytes: 1 %
Lymphocytes Relative: 9 %
Lymphs Abs: 1.1 10*3/uL (ref 0.7–4.0)
MCH: 27.3 pg (ref 26.0–34.0)
MCHC: 30.3 g/dL (ref 30.0–36.0)
MCV: 90.4 fL (ref 80.0–100.0)
Monocytes Absolute: 0.5 10*3/uL (ref 0.1–1.0)
Monocytes Relative: 4 %
Neutro Abs: 10.5 10*3/uL — ABNORMAL HIGH (ref 1.7–7.7)
Neutrophils Relative %: 86 %
Platelets: UNDETERMINED 10*3/uL (ref 150–400)
RBC: 4.79 MIL/uL (ref 3.87–5.11)
RDW: 13.2 % (ref 11.5–15.5)
WBC: 12.3 10*3/uL — ABNORMAL HIGH (ref 4.0–10.5)
nRBC: 0 % (ref 0.0–0.2)

## 2019-02-16 LAB — BLOOD GAS, ARTERIAL
Acid-Base Excess: 5.2 mmol/L — ABNORMAL HIGH (ref 0.0–2.0)
Bicarbonate: 29.8 mmol/L — ABNORMAL HIGH (ref 20.0–28.0)
Drawn by: 51147
O2 Content: 3 L/min
O2 Saturation: 95.6 %
PCO2 ART: 48.2 mmHg — AB (ref 32.0–48.0)
PH ART: 7.407 (ref 7.350–7.450)
Patient temperature: 98.6
pO2, Arterial: 80.2 mmHg — ABNORMAL LOW (ref 83.0–108.0)

## 2019-02-16 LAB — COMPREHENSIVE METABOLIC PANEL
ALT: 35 U/L (ref 0–44)
AST: 76 U/L — ABNORMAL HIGH (ref 15–41)
Albumin: 3 g/dL — ABNORMAL LOW (ref 3.5–5.0)
Alkaline Phosphatase: 54 U/L (ref 38–126)
Anion gap: 9 (ref 5–15)
BUN: 48 mg/dL — ABNORMAL HIGH (ref 8–23)
CO2: 25 mmol/L (ref 22–32)
Calcium: 10.7 mg/dL — ABNORMAL HIGH (ref 8.9–10.3)
Chloride: 104 mmol/L (ref 98–111)
Creatinine, Ser: 1.65 mg/dL — ABNORMAL HIGH (ref 0.44–1.00)
GFR calc Af Amer: 32 mL/min — ABNORMAL LOW (ref >60)
GFR calc non Af Amer: 28 mL/min — ABNORMAL LOW (ref >60)
Glucose, Bld: 91 mg/dL (ref 70–99)
POTASSIUM: 4 mmol/L (ref 3.5–5.1)
Sodium: 138 mmol/L (ref 135–145)
Total Bilirubin: 1.5 mg/dL — ABNORMAL HIGH (ref 0.3–1.2)
Total Protein: 6.5 g/dL (ref 6.5–8.1)

## 2019-02-16 LAB — CBG MONITORING, ED: Glucose-Capillary: 87 mg/dL (ref 70–99)

## 2019-02-16 LAB — URINALYSIS, ROUTINE W REFLEX MICROSCOPIC
Bilirubin Urine: NEGATIVE
Glucose, UA: NEGATIVE mg/dL
Ketones, ur: NEGATIVE mg/dL
Leukocytes,Ua: NEGATIVE
Nitrite: NEGATIVE
PH: 5 (ref 5.0–8.0)
Protein, ur: 30 mg/dL — AB
SPECIFIC GRAVITY, URINE: 1.023 (ref 1.005–1.030)

## 2019-02-16 LAB — BRAIN NATRIURETIC PEPTIDE: B NATRIURETIC PEPTIDE 5: 127.7 pg/mL — AB (ref 0.0–100.0)

## 2019-02-16 LAB — TROPONIN I: Troponin I: 0.04 ng/mL (ref <0.03)

## 2019-02-16 LAB — LACTIC ACID, PLASMA: Lactic Acid, Venous: 2.2 mmol/L (ref 0.5–1.9)

## 2019-02-16 MED ORDER — SODIUM CHLORIDE 0.9% FLUSH
3.0000 mL | Freq: Two times a day (BID) | INTRAVENOUS | Status: DC
  Administered 2019-02-16 – 2019-02-18 (×3): 3 mL via INTRAVENOUS

## 2019-02-16 MED ORDER — ACETAMINOPHEN 325 MG PO TABS: 650.0000 mg | ORAL_TABLET | Freq: Four times a day (QID) | ORAL | Status: DC | PRN

## 2019-02-16 MED ORDER — ALBUTEROL SULFATE HFA 108 (90 BASE) MCG/ACT IN AERS
2.0000 | INHALATION_SPRAY | Freq: Four times a day (QID) | RESPIRATORY_TRACT | Status: DC | PRN
  Filled 2019-02-16: qty 6.7

## 2019-02-16 MED ORDER — SODIUM CHLORIDE 0.9 % IV BOLUS: 500.0000 mL | Freq: Once | INTRAVENOUS | Status: DC

## 2019-02-16 MED ORDER — DORZOLAMIDE HCL 2 % OP SOLN
1.0000 [drp] | Freq: Three times a day (TID) | OPHTHALMIC | Status: DC
  Administered 2019-02-17 – 2019-02-18 (×4): 1 [drp] via OPHTHALMIC
  Filled 2019-02-16 (×2): qty 10

## 2019-02-16 MED ORDER — LATANOPROST 0.005 % OP SOLN
1.0000 [drp] | Freq: Every day | OPHTHALMIC | Status: DC
  Administered 2019-02-17: 1 [drp] via OPHTHALMIC
  Filled 2019-02-16: qty 2.5

## 2019-02-16 MED ORDER — ALLOPURINOL 300 MG PO TABS
300.0000 mg | ORAL_TABLET | Freq: Every day | ORAL | Status: DC
  Administered 2019-02-17 – 2019-02-18 (×2): 300 mg via ORAL
  Filled 2019-02-16 (×2): qty 1

## 2019-02-16 MED ORDER — HEPARIN SODIUM (PORCINE) 5000 UNIT/ML IJ SOLN
5000.0000 [IU] | Freq: Three times a day (TID) | INTRAMUSCULAR | Status: DC
  Administered 2019-02-16 – 2019-02-18 (×5): 5000 [IU] via SUBCUTANEOUS
  Filled 2019-02-16 (×5): qty 1

## 2019-02-16 MED ORDER — ACETAMINOPHEN 650 MG RE SUPP: 650.0000 mg | Freq: Four times a day (QID) | RECTAL | Status: DC | PRN

## 2019-02-16 MED ORDER — POLYETHYLENE GLYCOL 3350 17 G PO PACK
17.0000 g | PACK | Freq: Every day | ORAL | Status: DC | PRN
  Filled 2019-02-16: qty 1

## 2019-02-16 MED ORDER — SIMVASTATIN 20 MG PO TABS
40.0000 mg | ORAL_TABLET | Freq: Every day | ORAL | Status: DC
  Administered 2019-02-17 – 2019-02-18 (×2): 40 mg via ORAL
  Filled 2019-02-16 (×2): qty 2

## 2019-02-16 NOTE — ED Notes (Signed)
CRITICAL VALUE ALERT  Critical Value:  Lactic Acid 2.2  Date & Time Notied:  01/26/2019 1703  Provider Notified: Dr. Lita Mains

## 2019-02-16 NOTE — ED Notes (Signed)
ED TO INPATIENT HANDOFF REPORT  ED Nurse Name and Phone #: Emari Demmer [phone]  S Name/Age/Gender Leslie Warren 85 y.o. female Room/Bed: 021C/021C  Code Status   Code Status: Full Code  Home/SNF/Other Home Patient oriented to: self Is this baseline? No   Triage Complete: Triage complete  Chief Complaint family members COVID+; AMS; SOB  Triage Note Per ems pts daughter tested positive for covid 19 on the 20 of march , pt has been having cough and sob x 5 days went to see her pcp on Friday dx with bronchitis 3/27 and given prednisone , pt has beenaltered  toay very sleepy  20 rt ac   Allergies No Known Allergies  Level of Care/Admitting Diagnosis ED Disposition    ED Disposition Condition [place]: [place] [100100]  Level of Care: Telemetry Medical [104]  Diagnosis: Respiratory failure with hypoxia [facility]) [035009]  Admitting Physician: Aldine Contes [phone]  Attending Physician: Aldine Contes [phone]  Estimated length of stay: past midnight tomorrow  Certification:: I certify this patient will need inpatient services for at least 2 midnights  Bed request comments: 2W - High Risk Covid-19 Rule out  PT Class (Do Not Modify): Inpatient [101]  PT Acc Code (Do Not Modify): Private [1]       B Medical/Surgery History Past Medical History:  Diagnosis Date  . Asthma   . Depression   . Glaucoma   . Gout   . Hypercholesterolemia   . Hypertension   . Meningioma [facility])    Past Surgical History:  Procedure Laterality Date  . CESAREAN SECTION     x 1     A IV Location/Drains/Wounds Patient Lines/Drains/Airways Status   Active Line/Drains/Airways    Name:   Placement date:   Placement time:   Site:   Days:   Peripheral IV 02/15/2019 Left Antecubital   02/03/2019    1518    Antecubital   less than 1   External Urinary Catheter   02/14/2019    1627    -   less than 1          Intake/Output Last 24 hours No intake or  output data in the 24 hours ending 01/22/2019 1858  Labs/Imaging Results for orders placed or performed during the hospital encounter of 01/20/2019 (from the past 48 hour(s))  CBG monitoring, ED     Status: None   Collection Time: 02/06/2019  3:21 PM  Result Value Ref Range   Glucose-Capillary 87 70 - 99 mg/dL  CBC with Differential/Platelet     Status: Abnormal   Collection Time: 01/29/2019  3:36 PM  Result Value Ref Range   WBC 12.3 (H) 4.0 - 10.5 K/uL   RBC 4.79 3.87 - 5.11 MIL/uL   Hemoglobin 13.1 12.0 - 15.0 g/dL   HCT 43.3 36.0 - 46.0 %   MCV 90.4 80.0 - 100.0 fL   MCH 27.3 26.0 - 34.0 pg   MCHC 30.3 30.0 - 36.0 g/dL   RDW 13.2 11.5 - 15.5 %   Platelets PLATELET CLUMPS NOTED ON SMEAR, UNABLE TO ESTIMATE 150 - 400 K/uL   nRBC 0.0 0.0 - 0.2 %   Neutrophils Relative % 86 %   Neutro Abs 10.5 (H) 1.7 - 7.7 K/uL   Lymphocytes Relative 9 %   Lymphs Abs 1.1 0.7 - 4.0 K/uL   Monocytes Relative 4 %   Monocytes Absolute 0.5 0.1 - 1.0 K/uL   Eosinophils Relative 0 %  Eosinophils Absolute 0.0 0.0 - 0.5 K/uL   Basophils Relative 0 %   Basophils Absolute 0.0 0.0 - 0.1 K/uL   Immature Granulocytes 1 %   Abs Immature Granulocytes 0.07 0.00 - 0.07 K/uL    Comment: Performed at [facility], [place] [address] [place], [place] [address]  Comprehensive metabolic panel     Status: Abnormal   Collection Time: 02/07/2019  3:36 PM  Result Value Ref Range   Sodium 138 135 - 145 mmol/L   Potassium 4.0 3.5 - 5.1 mmol/L   Chloride 104 98 - 111 mmol/L   CO2 25 22 - 32 mmol/L   Glucose, Bld 91 70 - 99 mg/dL   BUN 48 (H) 8 - 23 mg/dL   Creatinine, Ser 1.65 (H) 0.44 - 1.00 mg/dL   Calcium 10.7 (H) 8.9 - 10.3 mg/dL   Total Protein 6.5 6.5 - 8.1 g/dL   Albumin 3.0 (L) 3.5 - 5.0 g/dL   AST 76 (H) 15 - 41 U/L   ALT 35 0 - 44 U/L   Alkaline Phosphatase 54 38 - 126 U/L   Total Bilirubin 1.5 (H) 0.3 - 1.2 mg/dL   GFR calc non Af Amer 28 (L) >60 mL/min   GFR calc Af Amer 32 (L) >60 mL/min   Anion gap 9  5 - 15    Comment: Performed at [facility], [place] [address], [place], [place] [address]  Troponin I - ONCE - STAT     Status: Abnormal   Collection Time: 02/03/2019  3:36 PM  Result Value Ref Range   Troponin I 0.04 (HH) <0.03 ng/mL    Comment: CRITICAL RESULT CALLED TO, READ BACK BY AND VERIFIED WITH: Gerrie Nordmann RN AT [id number] ON [id number] BY Marcos Eke Performed at [facility], [place] [address], [place], [place] [address]   Urinalysis, Routine w reflex microscopic     Status: Abnormal   Collection Time: 02/15/2019  4:00 PM  Result Value Ref Range   Color, Urine YELLOW YELLOW   APPearance CLEAR CLEAR   Specific Gravity, Urine 1.023 1.005 - 1.030   pH 5.0 5.0 - 8.0   Glucose, UA NEGATIVE NEGATIVE mg/dL   Hgb urine dipstick SMALL (A) NEGATIVE   Bilirubin Urine NEGATIVE NEGATIVE   Ketones, ur NEGATIVE NEGATIVE mg/dL   Protein, ur 30 (A) NEGATIVE mg/dL   Nitrite NEGATIVE NEGATIVE   Leukocytes,Ua NEGATIVE NEGATIVE   RBC / HPF 0-5 0 - 5 RBC/hpf   WBC, UA 0-5 0 - 5 WBC/hpf   Bacteria, UA RARE (A) NONE SEEN   Mucus PRESENT    Hyaline Casts, UA PRESENT     Comment: Performed at [facility], [address], [place], [place] [address]  Lactic acid, plasma     Status: Abnormal   Collection Time: 01/20/2019  4:06 PM  Result Value Ref Range   Lactic Acid, Venous 2.2 (HH) 0.5 - 1.9 mmol/L    Comment: CRITICAL RESULT CALLED TO, READ BACK BY AND VERIFIED WITHJacinto Reap [facility] RN AT 3220 ON [id number] BY K FORSYTH Performed at [facility], [place] [address] [place], [place] [address]   I-STAT 7, (LYTES, BLD GAS, ICA, H+H)     Status: Abnormal   Collection Time: 02/14/2019  4:08 PM  Result Value Ref Range   pH, Arterial 7.408 7.350 - 7.450   pCO2 arterial 44.0 32.0 - 48.0 mmHg   pO2, Arterial 55.0 (L) 83.0 - 108.0 mmHg  Bicarbonate 27.7 20.0 - 28.0 mmol/L   TCO2 29 22 - 32 mmol/L   O2 Saturation 88.0 %   Acid-Base Excess 3.0 (H) 0.0 - 2.0 mmol/L   Sodium 137 135 - 145 mmol/L   Potassium  3.2 (L) 3.5 - 5.1 mmol/L   Calcium, Ion 1.54 (HH) 1.15 - 1.40 mmol/L   HCT 41.0 36.0 - 46.0 %   Hemoglobin 13.9 12.0 - 15.0 g/dL   Patient temperature HIDE    Collection site RADIAL, ALLEN'S TEST ACCEPTABLE    Drawn by RT    Sample type ARTERIAL    Comment NOTIFIED PHYSICIAN    Dg Chest Port 1 View  Result Date: 02/02/2019 CLINICAL DATA:  Cough, shortness of breath. EXAM: PORTABLE CHEST 1 VIEW COMPARISON:  Radiographs of August 08, 2016. FINDINGS: Stable cardiomegaly. No pneumothorax or pleural effusion is noted. Minimal bibasilar subsegmental atelectasis or scarring is noted. The visualized skeletal structures are unremarkable. IMPRESSION: Minimal bibasilar subsegmental atelectasis or scarring. Electronically Signed   By: Marijo Conception, M.D.   On: 02/05/2019 16:02    Pending Labs Unresulted Labs (From admission, onward)    Start     Ordered   02/17/19 0500  Comprehensive metabolic panel  Tomorrow morning,   R     01/22/2019 1837   02/17/19 0500  CBC  Tomorrow morning,   R     01/26/2019 1837   01/29/2019 1837  Creatinine, serum  (heparin)  Once,   R    Comments:  Baseline for heparin therapy IF NOT ALREADY DRAWN.    02/09/2019 1837   01/24/2019 1528  Brain natriuretic peptide  Once,   R     02/09/2019 1528   02/10/2019 1528  Blood gas, arterial  Once,   STAT     02/01/2019 1528   02/02/2019 1528  Culture, blood (Routine X 2) w Reflex to ID Panel  BLOOD CULTURE X 2,   STAT     01/26/2019 1528          Vitals/Pain Today's Vitals   02/14/2019 1700 01/28/2019 1730 01/31/2019 1800 02/01/2019 1830  BP: 132/64 130/75 (!) 145/67 125/66  Pulse: (!) 53 79  84  Resp: (!) 30 (!) 26 (!) 27 (!) 30  Temp:      SpO2: 98% 100%  100%  PainSc:        Isolation Precautions Droplet and Contact precautions  Medications Medications  sodium chloride 0.9 % bolus 500 mL (has no administration in time range)  allopurinol (ZYLOPRIM) tablet 300 mg (has no administration in time range)  simvastatin (ZOCOR) tablet  40 mg (has no administration in time range)  albuterol (PROVENTIL HFA;VENTOLIN HFA) 108 (90 Base) MCG/ACT inhaler 2 puff (has no administration in time range)  latanoprost (XALATAN) 0.005 % ophthalmic solution 1 drop (has no administration in time range)  dorzolamide (TRUSOPT) 2 % ophthalmic solution 1 drop (1 drop Both Eyes Not Given 01/22/2019 1839)  heparin injection 5,000 Units (has no administration in time range)  sodium chloride flush (NS) 0.9 % injection 3 mL (has no administration in time range)  acetaminophen (TYLENOL) tablet 650 mg (has no administration in time range)    Or  acetaminophen (TYLENOL) suppository 650 mg (has no administration in time range)  polyethylene glycol (MIRALAX / GLYCOLAX) packet 17 g (has no administration in time range)    Mobility non-ambulatory High fall risk   Focused Assessments Pulmonary Assessment Handoff:  Lung sounds:   O2 Device: Nasal Cannula O2 Flow  Rate (L/min): 2 L/min      R Recommendations: See Admitting Provider Note  Report given to:   Additional Notes: Pt will wake up and open her eyes when you call her name, she can tell you her first name but that is all.

## 2019-02-16 NOTE — H&P (Signed)
Date: 01/27/2019               Patient Name:  Leslie Warren MRN: [id number]  DOB: Jul 06, 1931 Age / Sex: 85 y.o., female   PCP: Sandi Mariscal, MD         Medical Service: Internal Medicine Teaching Service         Attending Physician: Dr. Aldine Contes, MD    First Contact: Dr. Eileen Stanford  Pager:   Second Contact: Dr. Trilby Drummer  Pager:        After Hours (After 5p/  First Contact Pager: [phone]  weekends / holidays): Second Contact Pager: [phone]   Chief Complaint: cough, shortness of breath   History of Present Illness: Leslie Warren is an 85 y/o female with history of Dementia who presents with 1 week of cough, lethargy and shortness of breath. History was obtained via chart review and speaking with daughter over the phone, Cassandra. Leslie Warren lives at home with her daughter who takes care of her with the assistance from another daughter, Ivin Booty. Patient's daughter began having headaches, body aches, fever, diarrhea and cough on 3/20. Diagnosed with COVID-19 on 3/24. She has been in self-quarantine. Her mother developed symptoms of cough and subjective fever approximately 1 week ago. Leslie Warren presented to her PCP 4 days ago and was treated with steroid shot for presumed bronchitis. Daughter noted some symptomatic improvement for a day, but she subsequently became worse over the last 2 days with increased lethargy and was refusing to take medications due to sore throat.   Meds:  Allopurinol 300 mg daily Gabapentin 300 mg TID Accupril 20 mg daily Simvastatin 40 mg daily Mellaril 25 mg qhs Triamterene-HCTZ 37.5-25 mg daily    Allergies: Allergies as of 02/02/2019  . (No Known Allergies)   Past Medical History:  Diagnosis Date  . Asthma   . Depression   . Glaucoma   . Gout   . Hypercholesterolemia   . Hypertension   . Meningioma [facility])     Family History:  Family History  Problem Relation Age of Onset  . Heart attack Mother   . Diabetes Father   . Hypertension Father      Social History: Lives at home with daughter.   Review of Systems: A complete ROS was negative except as per HPI.   Physical Exam: Blood pressure 125/66, pulse 81, temperature 99.8 F (37.7 C), resp. rate (!) 29, SpO2 100 %. General: awake, arousable to voice, ill appearing HEENT: [place]/AT, PEERL Neck: supple; no thyromegaly CV: RRR; no murmurs Pulm: saturating on 2L [place]. Lungs CTAB Abd: BS+; large ventral hernia. No TTP Neuro: lethargic, arouses to voice, does not respond to questions, no focal deficits  Ext: no edema   EKG: personally reviewed my interpretation is low voltage. Sinus rhythm   CXR: personally reviewed my interpretation is bibasilar atelectasis.   Assessment & Plan by Problem: Active Problems:   Respiratory failure with hypoxia [facility] [address])  Leslie Warren is an 85 y/o female with history of Dementia who presents for 1 weeks of progressive cough, shortness of breath and lethargy. She lives with her daughter who was diagnosed with COVID-19 on 02/09/19. On arrival she was tachypneic and requiring 2L to maintain oxygen saturation. Work-up revealed leukocytosis of 12.3. CMP with BUN 48, Crt 1.65, AST 76. Lactic acid 2.2. U/A with rare bacteria.   1. Acute hypoxic respiratory failure with high suspicion for COVID-19  - droplet and contact precautions; COVID-19 testing pending  -  albuterol 2 puffs q 6 hrs - continue O2 supplementation to maintain saturation >92%  - blood cx pending - given high suspicion for COVID-19 with out obvious signs of bacterial infection and recent steroid injection to explain leukocytosis, will hold off on empiric abx at this time and continue supportive treatment.   2. AKI - crt 1.6 up from 1.1 - likely in the setting of decreased PO intake  - received 500 cc in ED; continue IVF at 100 cc/hr - continue trending  - holding ACE and HCTZ   Diet: NPO DVT ppx: Heparin CODE: FULL   Dispo: Admit patient to Inpatient with expected length of stay greater  than 2 midnights.  SignedDelice Bison, DO 02/05/2019, 7:08 PM  Pager: [phone]

## 2019-02-16 NOTE — ED Notes (Signed)
Unable to obtain second set of cultures

## 2019-02-16 NOTE — ED Triage Notes (Signed)
Per ems pts daughter tested positive for covid 19 on the 20 of march , pt has been having cough and sob x 5 days went to see her pcp on Friday dx with bronchitis 3/27 and given prednisone , pt has beenaltered  toay very sleepy  20 rt ac

## 2019-02-16 NOTE — ED Provider Notes (Signed)
[facility] EMERGENCY DEPARTMENT Provider Note   CSN: [id number] Arrival date & time: 02/07/2019  1449    History   Chief Complaint Chief Complaint  Patient presents with  . Altered Mental Status    HPI Leslie Warren is a 85 y.o. female.     HPI Patient presents with 5 days of shortness of breath and cough.  Was diagnosed with bronchitis 4 days ago and started on prednisone.  Per EMS patient has been increasingly sleepy today.  Reports that patient's daughter tested positive for COVID-19 on 3/20.  Patient is unable to provide history.  Level 5 caveat applies. Past Medical History:  Diagnosis Date  . Asthma   . Depression   . Glaucoma   . Gout   . Hypercholesterolemia   . Hypertension   . Meningioma [facility])     Patient Active Problem List   Diagnosis Date Noted  . Memory loss 07/09/2017  . Lumbar radiculopathy 08/08/2016  . Mild cognitive impairment 01/30/2016  . Abnormality of gait 01/30/2016  . Low back pain 01/30/2016  . Meningioma ([place]) 01/30/2016    Past Surgical History:  Procedure Laterality Date  . CESAREAN SECTION     x 1     OB History   No obstetric history on file.      Home Medications    Prior to Admission medications   Medication Sig Start Date End Date Taking? Authorizing Provider  albuterol (PROVENTIL HFA;VENTOLIN HFA) 108 (90 Base) MCG/ACT inhaler Inhale into the lungs every 6 (six) hours as needed for wheezing or shortness of breath.    [provider]  allopurinol (ZYLOPRIM) 300 MG tablet 300 mg daily.  10/16/16   [provider]  AZOPT 1 % ophthalmic suspension  10/23/16   [provider]  dorzolamide (TRUSOPT) 2 % ophthalmic solution Place 1 drop into both eyes 3 (three) times daily.  01/20/16   [provider]  gabapentin (NEURONTIN) 300 MG capsule Take 1 capsule (300 mg total) by mouth 3 (three) times daily. 03/18/16   Marcial Pacas, MD  ibuprofen (ADVIL,MOTRIN) 800 MG tablet Take 800 mg  by mouth every 8 (eight) hours as needed.    [provider]  LUMIGAN 0.01 % SOLN 1 drop at bedtime.  01/10/16   [provider]  meloxicam (MOBIC) 15 MG tablet Take 15 mg by mouth daily. 01/19/16   [provider]  quinapril (ACCUPRIL) 20 MG tablet Take 20 mg by mouth daily. 11/27/15   [provider]  simvastatin (ZOCOR) 40 MG tablet Take 40 mg by mouth daily. 01/08/16   [provider]  thioridazine (MELLARIL) 25 MG tablet Take 25 mg by mouth at bedtime. 01/08/16   [provider]  triamterene-hydrochlorothiazide (MAXZIDE-25) 37.5-25 MG tablet Take 1 tablet by mouth daily. 12/11/15   [provider]    Family History Family History  Problem Relation Age of Onset  . Heart attack Mother   . Diabetes Father   . Hypertension Father     Social History Social History   Tobacco Use  . Smoking status: Former Research scientist (life sciences)  . Smokeless tobacco: Never Used  . Tobacco comment: Quit 10+ years ago.  Substance Use Topics  . Alcohol use: No    Alcohol/week: 0.0 standard drinks  . Drug use: No     Allergies   Patient has no known allergies.   Review of Systems Review of Systems  Respiratory: Positive for cough and shortness of breath.  Physical Exam Updated Vital Signs BP 130/75   Pulse 79   Temp 99.8 F (37.7 C)   Resp (!) 26   SpO2 100%   Physical Exam Vitals signs and nursing note reviewed.  Constitutional:      Appearance: She is well-developed.     Comments: Drowsy  HENT:     Head: Normocephalic and atraumatic.  Neck:     Musculoskeletal: Normal range of motion and neck supple.  Cardiovascular:     Rate and Rhythm: Normal rate and regular rhythm.  Pulmonary:     Breath sounds: Normal breath sounds.     Comments: Tachypnea.  Increased work of breathing.  Few scattered rails.  No wheezing. Abdominal:     General: Bowel sounds are normal.     Palpations: Abdomen is soft.     Tenderness: There is no abdominal  tenderness. There is no guarding or rebound.  Musculoskeletal: Normal range of motion.        General: No swelling, tenderness, deformity or signs of injury.     Right lower leg: No edema.     Left lower leg: No edema.  Skin:    General: Skin is warm and dry.     Findings: No erythema or rash.  Neurological:     Comments: Oriented to person.  Generally weak.  No focal deficit.  Psychiatric:        Behavior: Behavior normal.      ED Treatments / Results  Labs (all labs ordered are listed, but only abnormal results are displayed) Labs Reviewed  CBC WITH DIFFERENTIAL/PLATELET - Abnormal; Notable for the following components:      Result Value   WBC 12.3 (*)    Neutro Abs 10.5 (*)    All other components within normal limits  COMPREHENSIVE METABOLIC PANEL - Abnormal; Notable for the following components:   BUN 48 (*)    Creatinine, Ser 1.65 (*)    Calcium 10.7 (*)    Albumin 3.0 (*)    AST 76 (*)    Total Bilirubin 1.5 (*)    GFR calc non Af Amer 28 (*)    GFR calc Af Amer 32 (*)    All other components within normal limits  TROPONIN I - Abnormal; Notable for the following components:   Troponin I 0.04 (*)    All other components within normal limits  URINALYSIS, ROUTINE W REFLEX MICROSCOPIC - Abnormal; Notable for the following components:   Hgb urine dipstick SMALL (*)    Protein, ur 30 (*)    Bacteria, UA RARE (*)    All other components within normal limits  LACTIC ACID, PLASMA - Abnormal; Notable for the following components:   Lactic Acid, Venous 2.2 (*)    All other components within normal limits  POCT I-STAT 7, (LYTES, BLD GAS, ICA,H+H) - Abnormal; Notable for the following components:   pO2, Arterial 55.0 (*)    Acid-Base Excess 3.0 (*)    Potassium 3.2 (*)    Calcium, Ion 1.54 (*)    All other components within normal limits  CULTURE, BLOOD (ROUTINE X 2)  CULTURE, BLOOD (ROUTINE X 2)  BRAIN NATRIURETIC PEPTIDE  BLOOD GAS, ARTERIAL  CBG MONITORING, ED     EKG EKG Interpretation  Date/Time:  Tuesday February 16 2019 15:17:18 EDT Ventricular Rate:  69 PR Interval:    QRS Duration: 76 QT Interval:  458 QTC Calculation: 491 R Axis:   -82 Text Interpretation:  Sinus rhythm Atrial premature  complexes Inferior infarct, old Probable anterolateral infarct, age indeterm Confirmed by Julianne Rice [phone]) on 02/12/2019 5:35:14 PM   Radiology Dg Chest Port 1 View  Result Date: 01/21/2019 CLINICAL DATA:  Cough, shortness of breath. EXAM: PORTABLE CHEST 1 VIEW COMPARISON:  Radiographs of August 08, 2016. FINDINGS: Stable cardiomegaly. No pneumothorax or pleural effusion is noted. Minimal bibasilar subsegmental atelectasis or scarring is noted. The visualized skeletal structures are unremarkable. IMPRESSION: Minimal bibasilar subsegmental atelectasis or scarring. Electronically Signed   By: Marijo Conception, M.D.   On: 02/07/2019 16:02    Procedures Procedures (including critical care time)  Medications Ordered in ED Medications  sodium chloride 0.9 % bolus 500 mL (has no administration in time range)  allopurinol (ZYLOPRIM) tablet 300 mg (has no administration in time range)  simvastatin (ZOCOR) tablet 40 mg (has no administration in time range)  albuterol (PROVENTIL HFA;VENTOLIN HFA) 108 (90 Base) MCG/ACT inhaler 2 puff (has no administration in time range)  latanoprost (XALATAN) 0.005 % ophthalmic solution 1 drop (has no administration in time range)  dorzolamide (TRUSOPT) 2 % ophthalmic solution 1 drop (has no administration in time range)     Initial Impression / Assessment and Plan / ED Course  I have reviewed the triage vital signs and the nursing notes.  Pertinent labs & imaging results that were available during my care of the patient were reviewed by me and considered in my medical decision making (see chart for details).        Bibasilar atelectasis/scarring on chest x-ray.  Mild elevation white blood cell count which could  the from her recent use of prednisone.  Troponin is mildly elevated as is her liver enzymes.  No acute focus for infection identified.  Concern given COVID exposure likely represents coronavirus infection and progression to shortness of breath.  Patient is now on 2 L of oxygen to maintain saturations in the mid to high 90s. Will discussed with inpatient service.  CHAMEKA MCMULLEN was evaluated in Emergency Department on 01/21/2019 for the symptoms described in the history of present illness. She was evaluated in the context of the global COVID-19 pandemic, which necessitated consideration that the patient might be at risk for infection with the SARS-CoV-2 virus that causes COVID-19. Institutional protocols and algorithms that pertain to the evaluation of patients at risk for COVID-19 are in a state of rapid change based on information released by regulatory bodies including the CDC and federal and state organizations. These policies and algorithms were followed during the patient's care in the ED. Final Clinical Impressions(s) / ED Diagnoses   Final diagnoses:  Dyspnea, unspecified type  AKI (acute kidney injury) [facility])    ED Discharge Orders    None       Julianne Rice, MD 01/25/2019 1801

## 2019-02-16 NOTE — ED Notes (Signed)
cbg 87

## 2019-02-16 NOTE — ED Notes (Signed)
Pt placed on 2L [place]

## 2019-02-16 NOTE — Progress Notes (Signed)
Paged to floor that was unresponsive. In brief this is a 85 y.o female who presented to the ED with 4-5 days of progressive encephalopathy and acute hypoxic respiratory failure in the setting of known COVID-19 exposure (daughter).   Upon entering the room the patient was somnolent and unresponsive to stimuli, glasgow coma score of ~5. She had a regular rate and rhythm. Pulmonary exam was limited due to body habitus and patient ability to participate. No evidence of volume overload.   Patient's family was contacted to confirm code status. The patient has never expressed prior wishes and family would like to continue with  FULL CODE status at this point. Discussed that the patient is high risk for COVID and is in a higher category for mortality based on her age and comorbidies. They again reiterated that they would like to leave her as a FULL CODE.   Assessment / Plan: This is a 85 y.o female who presented to the ED with 4-5 days of progressive encephalopathy and acute hypoxic respiratory failure in the setting of known COVID-19 exposure (daughter).  - Obtain ABG to assess for worsening hypoxia  - Consult PCCM for AMS and questionable ability to protect her airway  - COVID testing pending

## 2019-02-17 DIAGNOSIS — J9601 Acute respiratory failure with hypoxia: Secondary | ICD-10-CM

## 2019-02-17 LAB — COMPREHENSIVE METABOLIC PANEL
ALT: 27 U/L (ref 0–44)
AST: 41 U/L (ref 15–41)
Albumin: 1.6 g/dL — ABNORMAL LOW (ref 3.5–5.0)
Alkaline Phosphatase: 28 U/L — ABNORMAL LOW (ref 38–126)
Anion gap: 12 (ref 5–15)
BUN: 28 mg/dL — ABNORMAL HIGH (ref 8–23)
CHLORIDE: 119 mmol/L — AB (ref 98–111)
CO2: 19 mmol/L — ABNORMAL LOW (ref 22–32)
Calcium: 5.5 mg/dL — CL (ref 8.9–10.3)
Creatinine, Ser: 0.79 mg/dL (ref 0.44–1.00)
GFR calc Af Amer: >60 mL/min (ref >60)
GFR calc non Af Amer: >60 mL/min (ref >60)
Glucose, Bld: 74 mg/dL (ref 70–99)
Potassium: 2 mmol/L — CL (ref 3.5–5.1)
Sodium: 150 mmol/L — ABNORMAL HIGH (ref 135–145)
Total Bilirubin: 0.5 mg/dL (ref 0.3–1.2)
Total Protein: 3.3 g/dL — ABNORMAL LOW (ref 6.5–8.1)

## 2019-02-17 LAB — CBC
HCT: 31 % — ABNORMAL LOW (ref 36.0–46.0)
Hemoglobin: 9.1 g/dL — ABNORMAL LOW (ref 12.0–15.0)
MCH: 27.5 pg (ref 26.0–34.0)
MCHC: 29.4 g/dL — ABNORMAL LOW (ref 30.0–36.0)
MCV: 93.7 fL (ref 80.0–100.0)
Platelets: 124 10*3/uL — ABNORMAL LOW (ref 150–400)
RBC: 3.31 MIL/uL — AB (ref 3.87–5.11)
RDW: 13.2 % (ref 11.5–15.5)
WBC: 8.9 10*3/uL (ref 4.0–10.5)
nRBC: 0 % (ref 0.0–0.2)

## 2019-02-17 LAB — RESPIRATORY PANEL BY PCR
Adenovirus: NOT DETECTED
Bordetella pertussis: NOT DETECTED
CORONAVIRUS NL63-RVPPCR: NOT DETECTED
Chlamydophila pneumoniae: NOT DETECTED
Coronavirus 229E: NOT DETECTED
Coronavirus HKU1: NOT DETECTED
Coronavirus OC43: NOT DETECTED
Influenza A: NOT DETECTED
Influenza B: NOT DETECTED
Metapneumovirus: NOT DETECTED
Mycoplasma pneumoniae: NOT DETECTED
PARAINFLUENZA VIRUS 2-RVPPCR: NOT DETECTED
PARAINFLUENZA VIRUS 3-RVPPCR: NOT DETECTED
Parainfluenza Virus 1: NOT DETECTED
Parainfluenza Virus 4: NOT DETECTED
RHINOVIRUS / ENTEROVIRUS - RVPPCR: NOT DETECTED
Respiratory Syncytial Virus: NOT DETECTED

## 2019-02-17 LAB — BLOOD GAS, ARTERIAL
Acid-Base Excess: 5.3 mmol/L — ABNORMAL HIGH (ref 0.0–2.0)
Bicarbonate: 30.2 mmol/L — ABNORMAL HIGH (ref 20.0–28.0)
Drawn by: 511471
O2 Content: 4 L/min
O2 Saturation: 97.8 %
Patient temperature: 98.8
pCO2 arterial: 52.8 mmHg — ABNORMAL HIGH (ref 32.0–48.0)
pH, Arterial: 7.377 (ref 7.350–7.450)
pO2, Arterial: 110 mmHg — ABNORMAL HIGH (ref 83.0–108.0)

## 2019-02-17 LAB — GLUCOSE, CAPILLARY: Glucose-Capillary: 81 mg/dL (ref 70–99)

## 2019-02-17 LAB — AMMONIA: Ammonia: 24 umol/L (ref 9–35)

## 2019-02-17 LAB — BASIC METABOLIC PANEL
Anion gap: 11 (ref 5–15)
BUN: 41 mg/dL — ABNORMAL HIGH (ref 8–23)
CO2: 26 mmol/L (ref 22–32)
Calcium: 11 mg/dL — ABNORMAL HIGH (ref 8.9–10.3)
Chloride: 104 mmol/L (ref 98–111)
Creatinine, Ser: 1.31 mg/dL — ABNORMAL HIGH (ref 0.44–1.00)
GFR calc Af Amer: 43 mL/min — ABNORMAL LOW (ref >60)
GFR calc non Af Amer: 37 mL/min — ABNORMAL LOW (ref >60)
Glucose, Bld: 98 mg/dL (ref 70–99)
Potassium: 4.2 mmol/L (ref 3.5–5.1)
Sodium: 141 mmol/L (ref 135–145)

## 2019-02-17 LAB — MAGNESIUM: Magnesium: 2.3 mg/dL (ref 1.7–2.4)

## 2019-02-17 MED ORDER — DEXTROSE 5 % IV SOLN: INTRAVENOUS | Status: DC

## 2019-02-17 MED ORDER — POTASSIUM CHLORIDE 10 MEQ/100ML IV SOLN
10.0000 meq | INTRAVENOUS | Status: AC
  Administered 2019-02-17 (×2): 10 meq via INTRAVENOUS
  Filled 2019-02-17 (×2): qty 100

## 2019-02-17 MED ORDER — KCL IN DEXTROSE-NACL 20-5-0.9 MEQ/L-%-% IV SOLN
INTRAVENOUS | Status: DC
  Administered 2019-02-17 (×2): via INTRAVENOUS
  Filled 2019-02-17 (×2): qty 1000

## 2019-02-17 MED ORDER — SODIUM CHLORIDE 0.9 % IV SOLN
1.0000 g | Freq: Every day | INTRAVENOUS | Status: DC
  Administered 2019-02-17 (×2): 1 g via INTRAVENOUS
  Filled 2019-02-17 (×3): qty 10

## 2019-02-17 MED ORDER — ORAL CARE MOUTH RINSE
15.0000 mL | Freq: Two times a day (BID) | OROMUCOSAL | Status: DC
  Administered 2019-02-17 – 2019-02-18 (×3): 15 mL via OROMUCOSAL

## 2019-02-17 MED ORDER — SODIUM CHLORIDE 0.9 % IV SOLN
500.0000 mg | Freq: Every day | INTRAVENOUS | Status: DC
  Administered 2019-02-17 (×2): 500 mg via INTRAVENOUS
  Filled 2019-02-17 (×3): qty 500

## 2019-02-17 NOTE — Consult Note (Signed)
NAME:  Leslie Warren, MRN:  [id number], DOB:  05/12/1931, LOS: 1 ADMISSION DATE:  02/05/2019, CONSULTATION DATE:  4/1 REFERRING MD:  Dr. Dareen Piano, CHIEF COMPLAINT:  AMS, Hypoxia   Brief History   85 year old female with dementia admitted with URI symptoms including shortness of breath with known positive COVID-19 contact (daughter)  History of present illness   Patient is encephalopathic and/or intubated. Therefore history has been obtained from chart review. 85 year old female with PMH as below, which is significant for dementia, asthma, HTN, HLD. The patient lives at home with her daughter who cares for her. The patient's daughter Vito Backers began having flu like syndrome on 3/20 and on 3/24 was diagnosed with COVID-19 on 3/24. Then approximately 3/25 Mrs Pelto developed symptoms as well, which have gotten progressively worse. She was treated with steroid injection at PCP 3/26 for suspected bronchitis without significant improvement. She became more and more lethargic 3/31 and presented to the ED.   Upon arrival to the ED she was noted to be hypoxic requiring 2L to maintain adequate oxygen saturations. Given her COVID-19 exposure she was admitted to 2W under the IMTS. Over the course of the evening her oxygen requirements increased to 4L and she became increasingly lethargic. PCCM consulted for further evaluation.   Past Medical History   has a past medical history of Asthma, Depression, Glaucoma, Gout, Hypercholesterolemia, Hypertension, and Meningioma ([place]).   Significant Hospital Events   3/31 admit for hypoxic respiratory failure r/o COVID-19  Consults:  PCCM  Procedures:    Significant Diagnostic Tests:  Admission CXR: Minimal bibasilar subsegmental atelectasis or scarring.  Micro Data:  Blood 3/31 > Novel Coronavirus RVP  Antimicrobials:  Ceftriaxone 4/1 > Azithromycin 4/1 >  Interim history/subjective:  No events overnight, intermittent desaturation when patient takes  off her O2  Objective   Blood pressure (!) 147/80, pulse 65, temperature 98.8 F (37.1 C), temperature source Oral, resp. rate 14, SpO2 (!) 88 %.        Intake/Output Summary (Last 24 hours) at 02/17/2019 1118 Last data filed at 02/17/2019 1000 Gross per 24 hour  Intake 309.79 ml  Output -  Net 309.79 ml   There were no vitals filed for this visit.  Examination: General: Elderly female, NAD, chronically ill appearing HENT: [place]/AT, PERRL, EOM-I and MMM Lungs: Decreased BS diffusely Cardiovascular: RRR, Nl S1/S2 and -M/R/G Abdomen: Soft, NT, ND and +BS Extremities: -edema and -tenderness Neuro: Awake and follows commands  I reviewed CXR myself, mild infiltrate and atelectasis noted  Resolved Hospital Problem list     Assessment & Plan:   Acute encephalopathy: etiology not entirely clear. Minimally responsive at this time. She is somewhat hypoxic on ABG, but O2 sats are OK on 4L. AMS seems to be out of proportion to ABG. No clear metabolic explanation on ABG from earlier today.  - Transfer to progressive care - Head CT not necessary at this point, moving all ext to commands and interactive - Repeat BMP now, numbers are very aberrant compared to yesterdays  - Ammonia level is 24, unsure why she decompensated overnight  Acute hypoxemic respiratory failure: progressive now requiring 4L  with paradoxical respirations. Admission CXR not very impressive for bilateral infiltrates, but her daughter who she lives with has tested positive for Novel Coronavirus.  - Titrate O2 for sat of 88-92% - D/c further ABGs at this point - Monitor for airway protection - Contact/Airborne precautions until COVID is negative - Blood cultures, RVP, Novel  Coronavirus all pending.  - Initiate CAP antibiotics, rocephin and zithromax  Transaminitis - LFT normalized - Check ammonia 24  AKI - Follow BMP  Dementia: dependent on daughter for most ADLs. Typically able to recognize all family members. -  Supportive care.   Hypertension Hyperlipidemia - Holding home quinapril while NPO  Transfer back to progressive care and back to IMST.  Discussed with PCCM-NP.  Best practice:  Diet: NPO Pain/Anxiety/Delirium protocol (if indicated): N/a VAP protocol (if indicated): N/a DVT prophylaxis: heparin GI prophylaxis: N/a Glucose control: N/a Mobility: BR Code Status: FULL Family Communication: Updated daughter on phone. She is definite for full code and vent if required.  Disposition: ICU  Labs   CBC: Recent Labs  Lab 01/29/2019 1536 01/21/2019 1608 02/17/19 0557  WBC 12.3*  --  8.9  NEUTROABS 10.5*  --   --   HGB 13.1 13.9 9.1*  HCT 43.3 41.0 31.0*  MCV 90.4  --  93.7  PLT PLATELET CLUMPS NOTED ON SMEAR, UNABLE TO ESTIMATE  --  124*    Basic Metabolic Panel: Recent Labs  Lab 02/07/2019 1536 02/08/2019 1608 02/17/19 0557  NA 138 137 150*  K 4.0 3.2* 2.0*  CL 104  --  119*  CO2 25  --  19*  GLUCOSE 91  --  74  BUN 48*  --  28*  CREATININE 1.65*  --  0.79  CALCIUM 10.7*  --  5.5*   GFR: CrCl cannot be calculated (Unknown ideal weight.). Recent Labs  Lab 01/31/2019 1536 02/04/2019 1606 02/17/19 0557  WBC 12.3*  --  8.9  LATICACIDVEN  --  2.2*  --     Liver Function Tests: Recent Labs  Lab 02/09/2019 1536 02/17/19 0557  AST 76* 41  ALT 35 27  ALKPHOS 54 28*  BILITOT 1.5* 0.5  PROT 6.5 3.3*  ALBUMIN 3.0* 1.6*   No results for input(s): LIPASE, AMYLASE in the last 168 hours. Recent Labs  Lab 02/17/19 0557  AMMONIA 24    ABG    Component Value Date/Time   PHART 7.377 02/17/2019 0208   PCO2ART 52.8 (H) 02/17/2019 0208   PO2ART 110 (H) 02/17/2019 0208   HCO3 30.2 (H) 02/17/2019 0208   TCO2 29 02/12/2019 1608   O2SAT 97.8 02/17/2019 0208     Coagulation Profile: No results for input(s): INR, PROTIME in the last 168 hours.  Cardiac Enzymes: Recent Labs  Lab 02/01/2019 1536  TROPONINI 0.04*    HbA1C: No results found for: HGBA1C  CBG: Recent Labs   Lab 02/13/2019 1521 02/17/19 0245  GLUCAP 87 81   The patient is critically ill with multiple organ systems failure and requires high complexity decision making for assessment and support, frequent evaluation and titration of therapies, application of advanced monitoring technologies and extensive interpretation of multiple databases.   Critical Care Time devoted to patient care services described in this note is  37  Minutes. This time reflects time of care of this signee Dr Jennet Maduro. This critical care time does not reflect procedure time, or teaching time or supervisory time of PA/NP/Med student/Med Resident etc but could involve care discussion time.  Rush Farmer, M.D. [facility] Pulmonary/Critical Care Medicine. Pager: [phone]. After hours pager: [phone].

## 2019-02-17 NOTE — Consult Note (Addendum)
NAME:  Leslie Warren, MRN:  [id number], DOB:  30-Oct-1931, LOS: 1 ADMISSION DATE:  01/20/2019, CONSULTATION DATE:  4/1 REFERRING MD:  Dr. Dareen Piano, CHIEF COMPLAINT:  AMS, Hypoxia   Brief History   85 year old female with dementia admitted with URI symptoms including shortness of breath with known positive COVID-19 contact (daughter)  History of present illness   Patient is encephalopathic and/or intubated. Therefore history has been obtained from chart review. 85 year old female with PMH as below, which is significant for dementia, asthma, HTN, HLD. The patient lives at home with her daughter who cares for her. The patient's daughter Vito Backers began having flu like syndrome on 3/20 and on 3/24 was diagnosed with COVID-19 on 3/24. Then approximately 3/25 Mrs Timothy developed symptoms as well, which have gotten progressively worse. She was treated with steroid injection at PCP 3/26 for suspected bronchitis without significant improvement. She became more and more lethargic 3/31 and presented to the ED.   Upon arrival to the ED she was noted to be hypoxic requiring 2L to maintain adequate oxygen saturations. Given her COVID-19 exposure she was admitted to 2W under the IMTS. Over the course of the evening her oxygen requirements increased to 4L and she became increasingly lethargic. PCCM consulted for further evaluation.   Past Medical History   has a past medical history of Asthma, Depression, Glaucoma, Gout, Hypercholesterolemia, Hypertension, and Meningioma ([place]).   Significant Hospital Events   3/31 admit for hypoxic respiratory failure r/o COVID-19  Consults:  PCCM  Procedures:    Significant Diagnostic Tests:  Admission CXR: Minimal bibasilar subsegmental atelectasis or scarring.  Micro Data:  Blood 3/31 > Novel Coronavirus RVP  Antimicrobials:  Ceftriaxone 4/1 > Azithromycin 4/1 >  Interim history/subjective:    Objective   Blood pressure (!) 150/43, pulse 70, temperature  98.8 F (37.1 C), temperature source Oral, resp. rate (!) 31, SpO2 99 %.       No intake or output data in the 24 hours ending 02/17/19 0041 There were no vitals filed for this visit.  Examination: General: overweight elderly female in mild respirator ydistress HENT: [place]/AT, PERRL, no JVD Lungs: Poor air entry. Rapid shallow respirations. Accessory muscle use. Sats 96% on 4L [place].  Cardiovascular: RRR, no MRG Abdomen: Soft, non-tender. Large abdominal hernia. Extremities: No acute deformity. No edema Neuro: No verbal response. Eyes open spontaneously. Blinks to threat.   Resolved Hospital Problem list     Assessment & Plan:   Acute encephalopathy: etiology not entirely clear. Minimally responsive at this time. She is somewhat hypoxic on ABG, but O2 sats are OK on 4L. AMS seems to be out of proportion to ABG. No clear metabolic explanation on ABG from earlier today.  - Transfer to ICU.  - May require intubation for airway protection.  - CT head - Check ammonia - Repeat BMP - Repeat ABG  Acute hypoxemic respiratory failure: progressive now requiring 4L [place] with paradoxical respirations. Admission CXR not very impressive for bilateral infiltrates, but her daughter who she lives with has tested positive for Novel Coronavirus.  - Supplemental O2 to keep SpO2 > 92% - Repeat ABG - If any further escalation in FiO2 required, will need to consider intubation.  - Contact/Airborne precautions - Blood cultures, RVP, Novel Coronavirus all pending.  - Initiate CAP antibiotics.   Transaminitis - Trend LFT - Check ammonia  AKI - Follow BMP  Dementia: dependent on daughter for most ADLs. Typically able to recognize all family members. -  Supportive care.   Hypertension Hyperlipidemia - holding home quinapril while NPO  Best practice:  Diet: NPO Pain/Anxiety/Delirium protocol (if indicated): N/a VAP protocol (if indicated): N/a DVT prophylaxis: heparin GI prophylaxis: N/a Glucose  control: N/a Mobility: BR Code Status: FULL Family Communication: Updated daughter on phone. She is definite for full code and vent if required.  Disposition: ICU  Labs   CBC: Recent Labs  Lab 02/01/2019 1536 01/17/2019 1608  WBC 12.3*  --   NEUTROABS 10.5*  --   HGB 13.1 13.9  HCT 43.3 41.0  MCV 90.4  --   PLT PLATELET CLUMPS NOTED ON SMEAR, UNABLE TO ESTIMATE  --     Basic Metabolic Panel: Recent Labs  Lab 01/19/2019 1536 02/14/2019 1608  NA 138 137  K 4.0 3.2*  CL 104  --   CO2 25  --   GLUCOSE 91  --   BUN 48*  --   CREATININE 1.65*  --   CALCIUM 10.7*  --    GFR: CrCl cannot be calculated (Unknown ideal weight.). Recent Labs  Lab 01/18/2019 1536 02/06/2019 1606  WBC 12.3*  --   LATICACIDVEN  --  2.2*    Liver Function Tests: Recent Labs  Lab 02/03/2019 1536  AST 76*  ALT 35  ALKPHOS 54  BILITOT 1.5*  PROT 6.5  ALBUMIN 3.0*   No results for input(s): LIPASE, AMYLASE in the last 168 hours. No results for input(s): AMMONIA in the last 168 hours.  ABG    Component Value Date/Time   PHART 7.407 01/24/2019 2130   PCO2ART 48.2 (H) 01/30/2019 2130   PO2ART 80.2 (L) 02/07/2019 2130   HCO3 29.8 (H) 01/24/2019 2130   TCO2 29 02/07/2019 1608   O2SAT 95.6 01/30/2019 2130     Coagulation Profile: No results for input(s): INR, PROTIME in the last 168 hours.  Cardiac Enzymes: Recent Labs  Lab 02/08/2019 1536  TROPONINI 0.04*    HbA1C: No results found for: HGBA1C  CBG: Recent Labs  Lab 02/02/2019 1521  GLUCAP 87    Review of Systems:   Unable as patient is encephalopathic  Past Medical History  She,  has a past medical history of Asthma, Depression, Glaucoma, Gout, Hypercholesterolemia, Hypertension, and Meningioma ([place]).   Surgical History    Past Surgical History:  Procedure Laterality Date  . CESAREAN SECTION     x 1     Social History   reports that she has quit smoking. She has never used smokeless tobacco. She reports that she does not  drink alcohol or use drugs.   Family History   Her family history includes Diabetes in her father; Heart attack in her mother; Hypertension in her father.   Allergies Allergies  Allergen Reactions  . Donepezil Other (See Comments)    Caused the patient to have no appetite and she was very lethargic     Home Medications  Prior to Admission medications   Medication Sig Start Date End Date Taking? Authorizing Provider  acetaminophen (TYLENOL) 325 MG tablet Take 325-650 mg by mouth every 6 (six) hours as needed for mild pain or headache.   Yes [provider]  albuterol (PROVENTIL HFA;VENTOLIN HFA) 108 (90 Base) MCG/ACT inhaler Inhale 1-2 puffs into the lungs every 6 (six) hours as needed for wheezing or shortness of breath.    Yes [provider]  allopurinol (ZYLOPRIM) 300 MG tablet Take 300 mg by mouth daily.  10/16/16  Yes [provider]  dorzolamide (TRUSOPT)  2 % ophthalmic solution Place 1 drop into both eyes 3 (three) times daily.  01/20/16  Yes [provider]  ibuprofen (ADVIL,MOTRIN) 800 MG tablet Take 800 mg by mouth every 8 (eight) hours as needed (for pain).    Yes [provider]  LUMIGAN 0.01 % SOLN Place 1 drop into both eyes at bedtime.  01/10/16  Yes [provider]  meloxicam (MOBIC) 15 MG tablet Take 15 mg by mouth daily. 01/19/16  Yes [provider]  quinapril (ACCUPRIL) 20 MG tablet Take 20 mg by mouth daily. 11/27/15  Yes [provider]  simvastatin (ZOCOR) 40 MG tablet Take 40 mg by mouth daily. 01/08/16  Yes [provider]  thioridazine (MELLARIL) 10 MG tablet Take 10 mg by mouth at bedtime. 01/04/19  Yes [provider]  triamterene-hydrochlorothiazide (MAXZIDE-25) 37.5-25 MG tablet Take 1 tablet by mouth daily. 12/11/15  Yes [provider]  gabapentin (NEURONTIN) 300 MG capsule Take 1 capsule (300 mg total) by mouth 3 (three) times daily. Patient not taking: Reported on  01/31/2019 03/18/16   Marcial Pacas, MD     Critical care time: 16 mins     Georgann Housekeeper, AGACNP-BC [place] Pager [phone] or [phone]  02/17/2019 1:18 AM   Patient seen and examined, agree with above note.  I dictated the care and orders written for this patient under my direction. Acute toxic encephalopathy secondary to sepsis from suspected COVID-19 , recent exposure , daughter was diagnosed with COVID recently after a trip. Hx of dementia Has been on [place] 4 L today all day , CXR this afternoon with bibasilar atelectasis , she is breathing in the upper 20's and ABG shows adequate oxygen levels and PCO2 of 50.  Daughter is POA and indicates she wants her to be full code including life support.  We communicated that were her to need a breathing machine with hx her hx of dementia, possible COVID-19 and old age the chances of coming off the ventilator are close to zero. Consult Palliative care in am  Send viral panel and novel coronavirus pcr   Roxanne Mins, [place]

## 2019-02-17 DEATH — deceased

## 2019-02-18 ENCOUNTER — Inpatient Hospital Stay (HOSPITAL_COMMUNITY): Payer: Medicare HMO

## 2019-02-18 DIAGNOSIS — Z66 Do not resuscitate: Secondary | ICD-10-CM

## 2019-02-18 DIAGNOSIS — K469 Unspecified abdominal hernia without obstruction or gangrene: Secondary | ICD-10-CM

## 2019-02-18 DIAGNOSIS — N179 Acute kidney failure, unspecified: Secondary | ICD-10-CM

## 2019-02-18 DIAGNOSIS — F0390 Unspecified dementia without behavioral disturbance: Secondary | ICD-10-CM

## 2019-02-18 DIAGNOSIS — Z20828 Contact with and (suspected) exposure to other viral communicable diseases: Secondary | ICD-10-CM

## 2019-02-18 LAB — BLOOD CULTURE ID PANEL (REFLEXED)
Acinetobacter baumannii: NOT DETECTED
CANDIDA ALBICANS: NOT DETECTED
CANDIDA GLABRATA: NOT DETECTED
CANDIDA TROPICALIS: NOT DETECTED
Candida krusei: NOT DETECTED
Candida parapsilosis: NOT DETECTED
Enterobacter cloacae complex: NOT DETECTED
Enterobacteriaceae species: NOT DETECTED
Enterococcus species: NOT DETECTED
Escherichia coli: NOT DETECTED
Haemophilus influenzae: NOT DETECTED
KLEBSIELLA PNEUMONIAE: NOT DETECTED
Klebsiella oxytoca: NOT DETECTED
Listeria monocytogenes: NOT DETECTED
Methicillin resistance: NOT DETECTED
Neisseria meningitidis: NOT DETECTED
Proteus species: NOT DETECTED
Pseudomonas aeruginosa: NOT DETECTED
STAPHYLOCOCCUS SPECIES: DETECTED — AB
Serratia marcescens: NOT DETECTED
Staphylococcus aureus (BCID): NOT DETECTED
Streptococcus agalactiae: NOT DETECTED
Streptococcus pneumoniae: NOT DETECTED
Streptococcus pyogenes: NOT DETECTED
Streptococcus species: NOT DETECTED

## 2019-02-18 LAB — CBC
HCT: 45 % (ref 36.0–46.0)
Hemoglobin: 13.7 g/dL (ref 12.0–15.0)
MCH: 28 pg (ref 26.0–34.0)
MCHC: 30.4 g/dL (ref 30.0–36.0)
MCV: 91.8 fL (ref 80.0–100.0)
Platelets: 171 10*3/uL (ref 150–400)
RBC: 4.9 MIL/uL (ref 3.87–5.11)
RDW: 13.5 % (ref 11.5–15.5)
WBC: 10.3 10*3/uL (ref 4.0–10.5)
nRBC: 0 % (ref 0.0–0.2)

## 2019-02-18 LAB — BASIC METABOLIC PANEL
Anion gap: 6 (ref 5–15)
BUN: 35 mg/dL — ABNORMAL HIGH (ref 8–23)
CO2: 26 mmol/L (ref 22–32)
Calcium: 10.4 mg/dL — ABNORMAL HIGH (ref 8.9–10.3)
Chloride: 112 mmol/L — ABNORMAL HIGH (ref 98–111)
Creatinine, Ser: 1.07 mg/dL — ABNORMAL HIGH (ref 0.44–1.00)
GFR calc Af Amer: 55 mL/min — ABNORMAL LOW (ref >60)
GFR calc non Af Amer: 47 mL/min — ABNORMAL LOW (ref >60)
Glucose, Bld: 112 mg/dL — ABNORMAL HIGH (ref 70–99)
Potassium: 4.4 mmol/L (ref 3.5–5.1)
Sodium: 144 mmol/L (ref 135–145)

## 2019-02-18 LAB — PHOSPHORUS: Phosphorus: 1.8 mg/dL — ABNORMAL LOW (ref 2.5–4.6)

## 2019-02-18 LAB — MAGNESIUM: Magnesium: 2.3 mg/dL (ref 1.7–2.4)

## 2019-02-18 MED ORDER — ACETAMINOPHEN 650 MG RE SUPP: 650.0000 mg | Freq: Four times a day (QID) | RECTAL | Status: DC | PRN

## 2019-02-18 MED ORDER — MORPHINE BOLUS VIA INFUSION
5.0000 mg | INTRAVENOUS | Status: DC | PRN
  Filled 2019-02-18: qty 5

## 2019-02-18 MED ORDER — MORPHINE SULFATE (PF) 2 MG/ML IV SOLN: 2.0000 mg | INTRAVENOUS | Status: DC | PRN

## 2019-02-18 MED ORDER — ACETAMINOPHEN 325 MG PO TABS: 650.0000 mg | ORAL_TABLET | Freq: Four times a day (QID) | ORAL | Status: DC | PRN

## 2019-02-18 MED ORDER — K PHOS MONO-SOD PHOS DI & MONO 155-852-130 MG PO TABS
500.0000 mg | ORAL_TABLET | Freq: Two times a day (BID) | ORAL | Status: DC
  Administered 2019-02-18: 500 mg via ORAL
  Filled 2019-02-18: qty 2

## 2019-02-18 MED ORDER — DEXTROSE-NACL 5-0.9 % IV SOLN
INTRAVENOUS | Status: DC
  Administered 2019-02-18: 13:00:00 via INTRAVENOUS

## 2019-02-18 MED ORDER — GLYCOPYRROLATE 1 MG PO TABS: 1.0000 mg | ORAL_TABLET | ORAL | Status: DC | PRN

## 2019-02-18 MED ORDER — DIPHENHYDRAMINE HCL 50 MG/ML IJ SOLN: 25.0000 mg | INTRAMUSCULAR | Status: DC | PRN

## 2019-02-18 MED ORDER — POLYVINYL ALCOHOL 1.4 % OP SOLN
1.0000 [drp] | Freq: Four times a day (QID) | OPHTHALMIC | Status: DC | PRN
  Filled 2019-02-18: qty 15

## 2019-02-18 MED ORDER — GLYCOPYRROLATE 0.2 MG/ML IJ SOLN: 0.2000 mg | INTRAMUSCULAR | Status: DC | PRN

## 2019-02-18 MED ORDER — MORPHINE 100MG IN NS 100ML (1MG/ML) PREMIX INFUSION
0.0000 mg/h | INTRAVENOUS | Status: DC
  Administered 2019-02-18: 2 mg/h via INTRAVENOUS
  Filled 2019-02-18: qty 100

## 2019-02-18 MED ORDER — LORAZEPAM 2 MG/ML IJ SOLN
2.0000 mg | INTRAMUSCULAR | Status: DC | PRN
  Administered 2019-02-18: 2 mg via INTRAVENOUS
  Filled 2019-02-18: qty 1

## 2019-02-18 NOTE — Progress Notes (Signed)
     Subjective: Called to assess patient by nurse secondary to worsening hypoxia.  Patient's O2 sats initially dropped to the 60s and she required a nonrebreather.  She improved slightly and is now satting in the 90s on 6 L nasal cannula.  She remains tachypneic in the 30s.  Objective:  Vital signs in last 24 hours: Vitals:   02/18/19 0400 02/18/19 0421 02/18/19 0500 02/18/19 0600  BP: 135/68  127/74 140/71  Pulse: 64 79 (!) 40 68  Resp: (!) 28 (!) 30 (!) 32 (!) 25  Temp: 99.5 F (37.5 C)     TempSrc: Oral     SpO2: 92% 95% (!) 85% 93%  Weight:      Height:       General: Awake, alert, confused CVS: Regular rate and rhythm, normal heart sounds Lungs: Difficult to auscultate but patient with faint bibasilar crackles Abdomen: Soft, nontender, nondistended, normoactive bowel sounds, abdominal hernia noted Extremities: No edema noted  Assessment/Plan:  Active Problems:   Respiratory failure with hypoxia [facility])  Patient is an 85 year old female with a past medical history of dementia who presented with progressive shortness of breath and cough and lethargy over the last week in the setting of her daughter being diagnosed with COVID-19 on March 24.  1.  Acute hypoxic respiratory failure likely secondary COVID-19: -We will continue with airborne and contact precautions for now -Patient with worsening hypoxia today and was noted to be tachypneic in the 30s.  We discussed the case with patient's daughter who wanted patient to remain full code at the time.  We contacted PCCM for follow-up.  They discussed the case with the daughter and patient is now DNR/DNI.  Patient family does not want comfort care at this time but if respiratory status worsens they would consider transitioning to this. -Would consider palliative care consult for symptom management given progressively worsening respiratory failure and goals of care discussion with family -Blood cultures with 1 bottle positive for coag  negative staph which is likely a contaminant.  Remaining blood cultures negative to date.  We will continue to follow -Continue ceftriaxone and azithromycin for now to cover for possible underlying pneumonia -Repeat chest x-ray done today showed developing interstitial edema and possible infiltrate secondary to rotation of the x-ray versus airspace consolidation or atelectasis. -We will hold off on ABG for now given patient's change in status -Continue with O2 supplementation as needed -RVP is negative.  We will follow-up coronavirus testing -Patient also was noted to have AKI on admission with creatinine up to 1.6.  This is improved to 1.07 today.  We will continue to monitor closely.  Dispo: Anticipated discharge in approximately 5-6 day(s).   Aldine Contes, MD 02/18/2019, 10:44 AM

## 2019-02-18 NOTE — Significant Event (Signed)
Rapid Response Event Note  Overview:  RN called for Sp02 sats 60% on 4L [place]     Initial Focused Assessment: On arrival pt sitting upright in bed, using accessory muscle with breathing, RR 30-35, sats 90-93% on NRB.   Interventions: CXR NRB Plan of Care (if not transferred): CCM spoke with pt's daughter. Code status changed to DNR/DNI. Plan to make pt comfort care if she continued to decline.  Event Summary:   at      at          [facility], Leslie Warren

## 2019-02-18 NOTE — Progress Notes (Signed)
I was called to re evaluate patient secondary to worsening hypoxic respiratory failure. Patient is now tachycardic up to the 140s-150s, hypoxic to the 80s on 100% NRB, and tachypneic in the 30s-40s. Patient opens eyes to name and nods to questions but falls back asleep quickly. Resident called patient's daughter and explained to her the patient's deterioration and grave prognosis and the decision was made to transition her to comfort care. Will start her on a morphine drip for comfort given her breathing discomfort. Palliative care consult called. Await further recommendations. Patient with likely imminent demise and is not stable for transfer to hospice facility.

## 2019-02-18 NOTE — Progress Notes (Signed)
Spoke with NP, who is going to cancel ABG

## 2019-02-18 NOTE — Plan of Care (Signed)

## 2019-02-18 NOTE — Consult Note (Signed)
PCCM Brief Consuly Note   02/18/2019 PCCM was called to the bedside to evaluate Ms. Leslie Warren who has been hospitalized as COVID-19 rule out 01/17/2019. On 02/18/2019 the patient exhibited worsening hypoxia with increasing oxygen requirement, and PCCM was consulted to evaluate patient for further care and management.   I reviewed the patient's chart, and spoke with the patient's medical decision maker (her daughter Vito Backers) and updated the daughter on the patient's acute decompensation.  I discussed goals of care with the patient's daughter, and the decision was made that the patient will not be intubated if her respiratory failure progresses. We discussed full code status and a decision was made to make the patient full DNR/DNI. The daughter does not wish for transition to comfort care measures at this time, and wishes for continued support with supplemental oxygen.    Summary: -I will update code status to reflect DNR/DNI -The patient will receive continued medical support at current level of care (Supplemental oxygen without NPPV or invasive mechanical ventilation) -If respiratory status worsens, the daughter wishes for transition toward comfort care but would please like to be updated on the patient's status prior to initiation of comfort measures -If patient respiratory status is worsening, I would recommend consulting Palliative Care Medicine team for symptom management    Contact Information for Decision Maker:  Name- Cassandra Relationship- Patient's Daughter Contact- [phone]    Thank you for consulting PCCM.   Eliseo Gum MSN, AGACNP-BC [place] [phone] If no answer, [phone] 02/18/2019, 8:52 AM

## 2019-02-18 NOTE — Progress Notes (Signed)
PHARMACY - PHYSICIAN COMMUNICATION CRITICAL VALUE ALERT - BLOOD CULTURE IDENTIFICATION (BCID)  Leslie Warren is an 85 y.o. female who presented to [facility] on 02/12/2019 with a chief complaint of  progressive encephalopathy and acute hypoxic respiratory failure in the setting of known COVID-19 exposure (daughter).   Assessment:  1/3 bottles growing staph species, mecA negative. Likely contaminant  Name of physician (or Provider) Contacted: [place]  Current antibiotics: Azithromycin and Ceftriaxone  Changes to prescribed antibiotics recommended:  Patient is on recommended antibiotics - No changes needed  Results for orders placed or performed during the hospital encounter of 02/01/2019  Blood Culture ID Panel (Reflexed) (Collected: 02/07/2019  4:15 PM)  Result Value Ref Range   Enterococcus species NOT DETECTED NOT DETECTED   Listeria monocytogenes NOT DETECTED NOT DETECTED   Staphylococcus species DETECTED (A) NOT DETECTED   Staphylococcus aureus (BCID) NOT DETECTED NOT DETECTED   Methicillin resistance NOT DETECTED NOT DETECTED   Streptococcus species NOT DETECTED NOT DETECTED   Streptococcus agalactiae NOT DETECTED NOT DETECTED   Streptococcus pneumoniae NOT DETECTED NOT DETECTED   Streptococcus pyogenes NOT DETECTED NOT DETECTED   Acinetobacter baumannii NOT DETECTED NOT DETECTED   Enterobacteriaceae species NOT DETECTED NOT DETECTED   Enterobacter cloacae complex NOT DETECTED NOT DETECTED   Escherichia coli NOT DETECTED NOT DETECTED   Klebsiella oxytoca NOT DETECTED NOT DETECTED   Klebsiella pneumoniae NOT DETECTED NOT DETECTED   Proteus species NOT DETECTED NOT DETECTED   Serratia marcescens NOT DETECTED NOT DETECTED   Haemophilus influenzae NOT DETECTED NOT DETECTED   Neisseria meningitidis NOT DETECTED NOT DETECTED   Pseudomonas aeruginosa NOT DETECTED NOT DETECTED   Candida albicans NOT DETECTED NOT DETECTED   Candida glabrata NOT DETECTED NOT DETECTED   Candida krusei  NOT DETECTED NOT DETECTED   Candida parapsilosis NOT DETECTED NOT DETECTED   Candida tropicalis NOT DETECTED NOT DETECTED    Sherlon Handing, PharmD, BCPS Clinical pharmacist  **Pharmacist phone directory can now be found on [email or web address] ([email or web address]).  Listed under [place]. 02/18/2019  12:36 AM

## 2019-02-19 LAB — CULTURE, BLOOD (ROUTINE X 2): Special Requests: ADEQUATE

## 2019-02-21 LAB — CULTURE, BLOOD (ROUTINE X 2): Culture: NO GROWTH

## 2019-02-22 LAB — NOVEL CORONAVIRUS, NAA (HOSP ORDER, SEND-OUT TO REF LAB; TAT 18-24 HRS): SARS-CoV-2, NAA: DETECTED — AB

## 2019-03-19 NOTE — Discharge Summary (Signed)
  Name: Leslie Warren MRN: [id number] DOB: 07/16/1931 85 y.o.  Date of Admission: 01/19/2019  2:49 PM Date of Discharge: Mar 19, 2019 Attending Physician: Dr. Dareen Piano   Discharge Diagnosis: Active Problems:   Respiratory failure with hypoxia ([place]) Acute hypoxic respiratory failure likely secondary to COVID-19 Dementia Asthma Hypertension Hyperlipidemia Glaucoma Gout Hypercholesterolemia  Cause of death: Acute hypoxic respiratory failure likely secondary to COVID-19 Time of death: 02:00  Disposition and follow-up:   [name] was discharged from [facility] in expired condition.    Hospital Course: Acute hypoxic respiratory failure likely secondary to COVID-19:  Ms. Leslie Warren is an 85 year old African-American woman with dementia, asthma who presented to [facility] emergency department on February 16, 2019 with 1 week history of lethargy, progressive encephalopathy and shortness of breath.  She lived with her daughter who recently tested positive for COVID-19 on February 09, 2019.  At the interim, she had been experiencing cough and subjective fevers and chills.  He had followed up with her PCP about 4 days prior to admission and was given a short course of steroids for presumed bronchitis.  Her symptoms had improved however worsened about 2 to 3 days prior to presenting to the ED.   On hospital day 0,, she was noted to be somnolent and unresponsive.  There was concern for her ability to protect her airway and the decision was made to transfer patient to the ICU given that the daughter had wanted full scope of care.  While in the ICU, she was started on ceftriaxone and azithromycin for presumed community-acquired pneumonia as her chest x-ray had shown bilateral infiltrates.  She was requiring supplemental oxygen via nasal cannula between 4 L to 6 L.  Her ABGs showed mild hypercapnia with PCO2 range 48-52 and hypoxemia with PO2 of 80.  It is worth noting that her respiratory  viral panel was negative and COVID-19 testing is pending. Ms. Leslie Warren continue to have worsening hypoxic respiratory failure with unstable.  She was tachycardic to 140s-150s, hypoxic to 80s on 100% nonrebreather mask, tachypneic with range 30s to 40s.  Though she was able to open her eye to her name and nods on questioning but was somnolent.  Critical care team was asked to reevaluate patient and upon further discussion with patient's daughter regarding her deteriorating and grave poor prognosis, the decision was made to transition to full comfort care.  Signed: Jean Rosenthal, MD 19-Mar-2019, 9:23 AM

## 2019-03-19 NOTE — Progress Notes (Signed)
Patient ceased to breathe, b/p 0/0 HR 0 Respirations 0. Pronounced death @ 0200 by two RN. Dorrell MD notified Daughter Horton Chin notified awaiting return call back after Ms. alston contacts  other siblings to determine if they are coming to view the body.

## 2019-03-19 DEATH — deceased

## 2019-08-02 IMAGING — DX PORTABLE CHEST - 1 VIEW
1 series · 1 of 1 positions shown · non-contrast
Comparison: Radiographs August 08, 2016.

CLINICAL DATA: Cough, shortness of breath.

EXAM:
PORTABLE CHEST 1 VIEW

[chest]
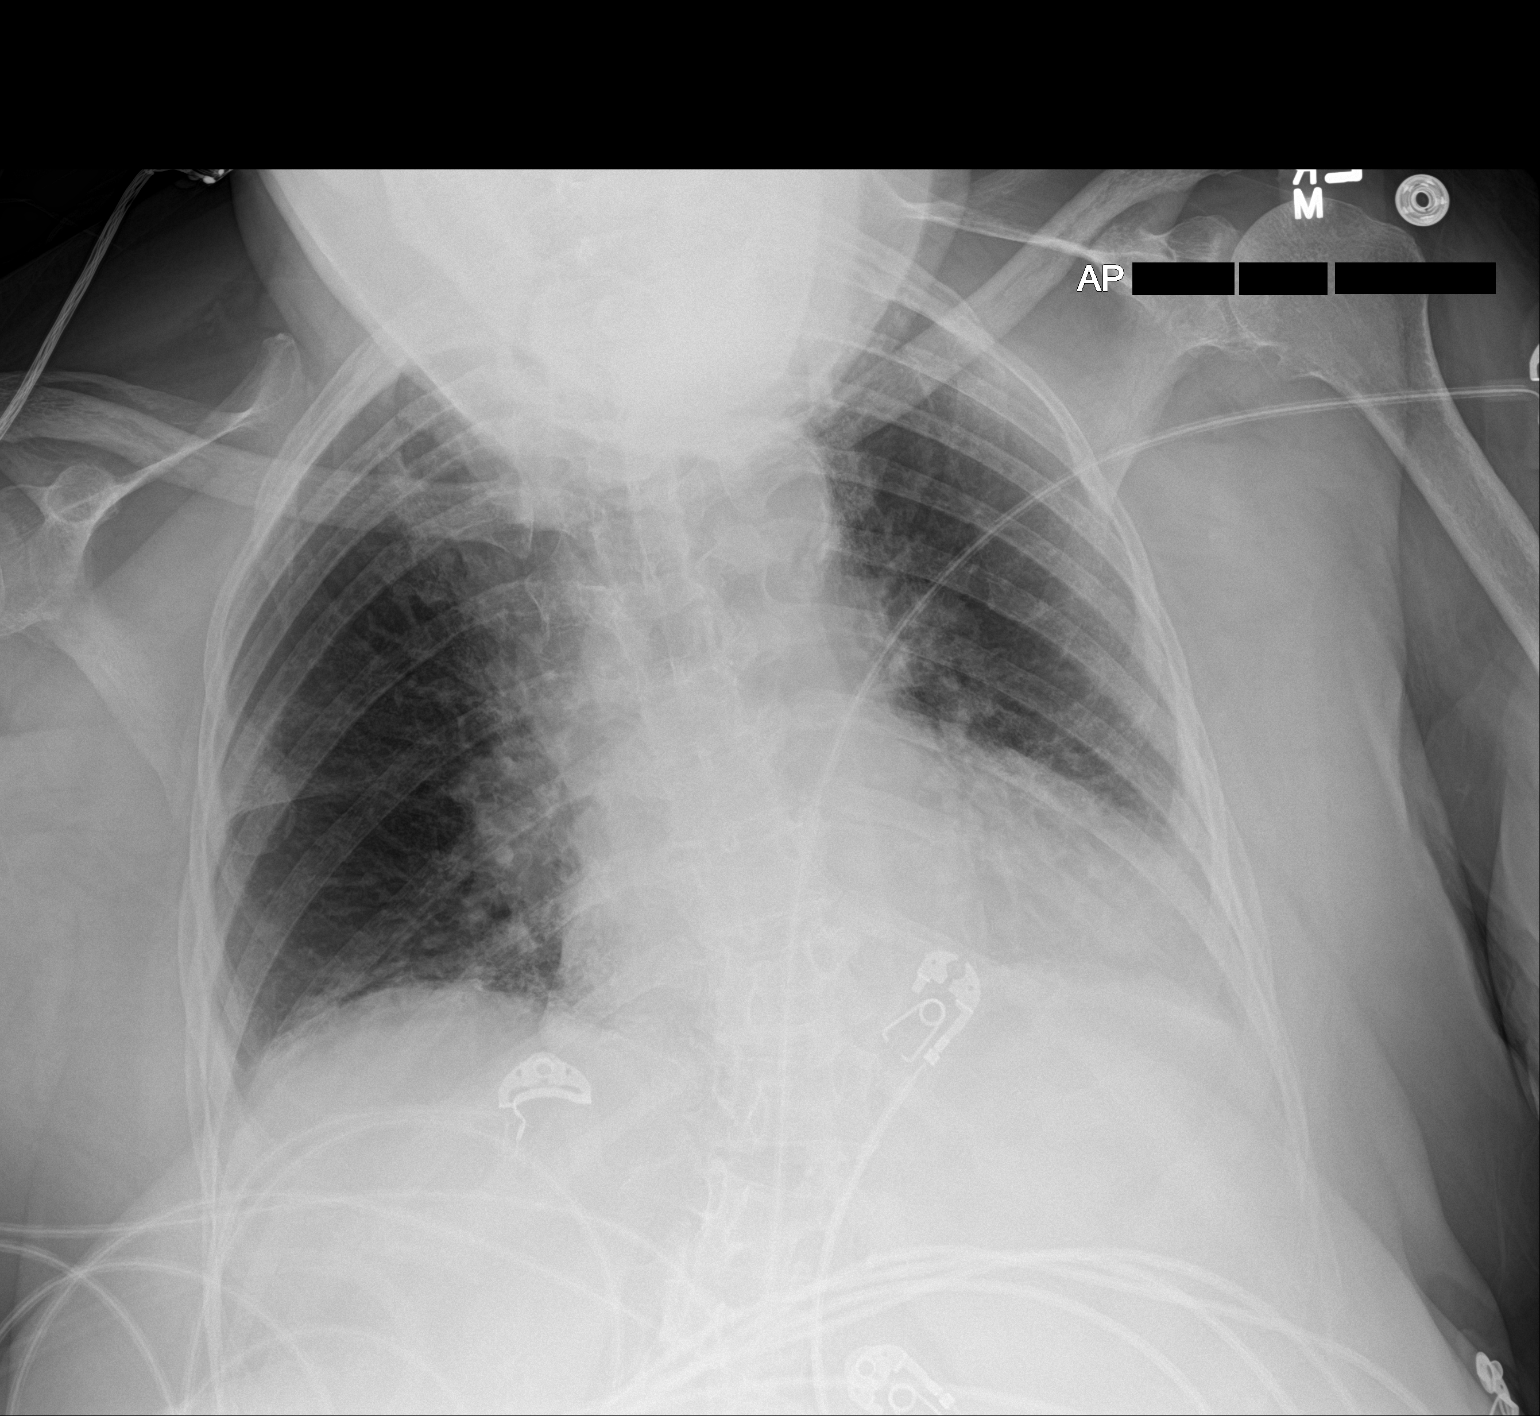

[1 of 1 positions shown; findings below may reference images not displayed]

FINDINGS: Stable cardiomegaly. No pneumothorax or pleural effusion is noted.
Minimal bibasilar subsegmental atelectasis or scarring is noted. The
visualized skeletal structures are unremarkable.
IMPRESSION: Minimal bibasilar subsegmental atelectasis or scarring.

## 2019-08-04 IMAGING — DX PORTABLE CHEST - 1 VIEW
1 series · 1 of 1 positions shown · non-contrast
Comparison: 02/16/2019, 08/08/2016

CLINICAL DATA: 87-year-old female with hypoxia

EXAM:
PORTABLE CHEST 1 VIEW

[chest ap]
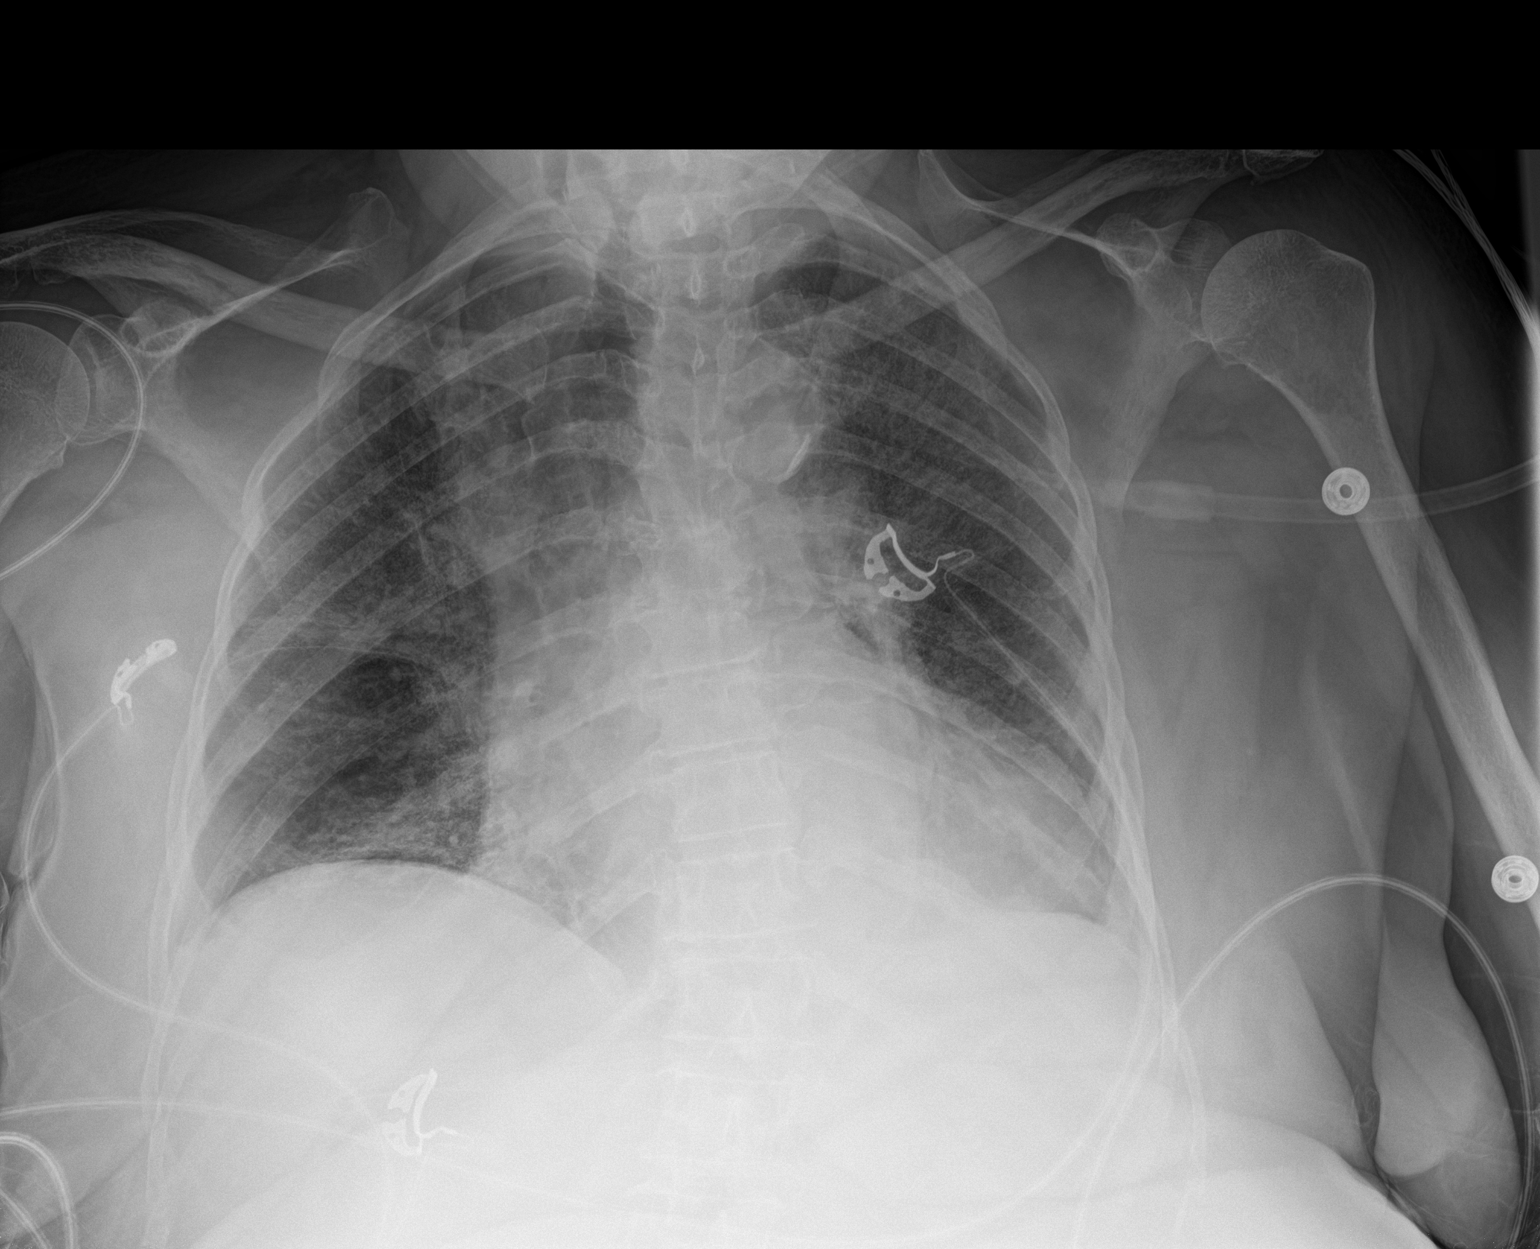

[1 of 1 positions shown; findings below may reference images not displayed]

FINDINGS: Right rotation, accentuating the right heart border and the right
mediastinal border. Opacity in the right suprahilar region.

Low lung volumes. Interlobular septal thickening with reticular
opacities.

Minor fissure more prominent than on the comparison plain film.

No pneumothorax.  No pleural effusion.
IMPRESSION: Pattern of interlobular septal thickening and reticular opacities
suggest developing pulmonary edema.

Right suprahilar opacity may be secondary to the right rotation and
a prominent mediastinal fat pad, however, new airspace consolidation
and or atelectasis could have this appearance.
